# Patient Record
Sex: Male | Born: 1965 | Marital: Single | Smoking: Never smoker
Health system: Southern US, Community
[De-identification: ages and names within clinical notes are randomized; demographics above are authoritative.]

## PROBLEM LIST (undated history)

## (undated) DIAGNOSIS — E785 Hyperlipidemia, unspecified: Secondary | ICD-10-CM

## (undated) DIAGNOSIS — T7840XA Allergy, unspecified, initial encounter: Secondary | ICD-10-CM

## (undated) DIAGNOSIS — J45909 Unspecified asthma, uncomplicated: Secondary | ICD-10-CM

## (undated) DIAGNOSIS — I10 Essential (primary) hypertension: Secondary | ICD-10-CM

## (undated) HISTORY — DX: Essential (primary) hypertension: I10

## (undated) HISTORY — DX: Allergy, unspecified, initial encounter: T78.40XA

## (undated) HISTORY — PX: NASAL SEPTUM SURGERY: SHX37

## (undated) HISTORY — DX: Hyperlipidemia, unspecified: E78.5

## (undated) HISTORY — DX: Unspecified asthma, uncomplicated: J45.909

## (undated) HISTORY — PX: SHOULDER SURGERY: SHX246

---

## 2011-12-09 ENCOUNTER — Ambulatory Visit (INDEPENDENT_AMBULATORY_CARE_PROVIDER_SITE_OTHER): Payer: BC Managed Care – PPO | Admitting: Internal Medicine

## 2011-12-09 DIAGNOSIS — Z79899 Other long term (current) drug therapy: Secondary | ICD-10-CM

## 2011-12-09 DIAGNOSIS — I10 Essential (primary) hypertension: Secondary | ICD-10-CM

## 2011-12-09 DIAGNOSIS — J45909 Unspecified asthma, uncomplicated: Secondary | ICD-10-CM

## 2011-12-09 DIAGNOSIS — Z23 Encounter for immunization: Secondary | ICD-10-CM

## 2012-06-29 ENCOUNTER — Other Ambulatory Visit: Payer: Self-pay | Admitting: Physician Assistant

## 2012-06-30 ENCOUNTER — Other Ambulatory Visit: Payer: Self-pay | Admitting: Physician Assistant

## 2012-10-01 ENCOUNTER — Other Ambulatory Visit: Payer: Self-pay | Admitting: Physician Assistant

## 2012-10-01 NOTE — Telephone Encounter (Signed)
Chart pulled to PA pool at nurses station [phone]

## 2012-10-29 ENCOUNTER — Ambulatory Visit (INDEPENDENT_AMBULATORY_CARE_PROVIDER_SITE_OTHER): Payer: BC Managed Care – PPO | Admitting: Internal Medicine

## 2012-10-29 VITALS — BP 112/84 | HR 70 | Temp 97.9°F | Resp 16 | Ht 69.38 in | Wt 194.4 lb

## 2012-10-29 DIAGNOSIS — Z23 Encounter for immunization: Secondary | ICD-10-CM

## 2012-10-29 DIAGNOSIS — Z7189 Other specified counseling: Secondary | ICD-10-CM

## 2012-10-29 DIAGNOSIS — I10 Essential (primary) hypertension: Secondary | ICD-10-CM | POA: Insufficient documentation

## 2012-10-29 DIAGNOSIS — Z79899 Other long term (current) drug therapy: Secondary | ICD-10-CM

## 2012-10-29 DIAGNOSIS — E789 Disorder of lipoprotein metabolism, unspecified: Secondary | ICD-10-CM | POA: Insufficient documentation

## 2012-10-29 LAB — POCT URINALYSIS DIPSTICK
Blood, UA: NEGATIVE
Ketones, UA: NEGATIVE
Protein, UA: NEGATIVE
Spec Grav, UA: 1.015
Urobilinogen, UA: 0.2

## 2012-10-29 LAB — TSH: TSH: 1.422 u[IU]/mL (ref 0.350–4.500)

## 2012-10-29 LAB — COMPREHENSIVE METABOLIC PANEL
ALT: 25 U/L (ref 0–53)
CO2: 25 mEq/L (ref 19–32)
Creat: 0.97 mg/dL (ref 0.50–1.35)
Total Bilirubin: 0.7 mg/dL (ref 0.3–1.2)

## 2012-10-29 LAB — POCT CBC
Granulocyte percent: 57.9 %G (ref 37–80)
HCT, POC: 43.9 % (ref 43.5–53.7)
Lymph, poc: 1.4 (ref 0.6–3.4)
MCH, POC: 29.6 pg (ref 27–31.2)
MCHC: 33.3 g/dL (ref 31.8–35.4)
MCV: 88.9 fL (ref 80–97)
MID (cbc): 0.3 (ref 0–0.9)
POC LYMPH PERCENT: 34.8 %L (ref 10–50)
Platelet Count, POC: 234 10*3/uL (ref 142–424)
RDW, POC: 12.9 %
WBC: 4.1 10*3/uL — AB (ref 4.6–10.2)

## 2012-10-29 LAB — LIPID PANEL
Cholesterol: 187 mg/dL (ref 0–200)
HDL: 46 mg/dL (ref >39)
Total CHOL/HDL Ratio: 4.1 Ratio
Triglycerides: 160 mg/dL — ABNORMAL HIGH (ref <150)

## 2012-10-29 NOTE — Patient Instructions (Signed)

## 2012-10-29 NOTE — Progress Notes (Signed)
  Subjective:    Patient ID: ARJAN STANTON, male    DOB: 07/02/1966, 46 y.o.   MRN: [id number]  HPI Doing well, feels good. HTN and lipids controlled No problems with meds   Review of Systems stable    Objective:   Physical Exam  Constitutional: He is oriented to person, place, and time. He appears well-developed and well-nourished.  HENT:  Nose: Nose normal.  Eyes: EOM are normal.  Neck: Neck supple.  Cardiovascular: Normal rate, regular rhythm and normal heart sounds.   Pulmonary/Chest: Effort normal and breath sounds normal.  Neurological: He is alert and oriented to person, place, and time. Coordination normal.  Psychiatric: He has a normal mood and affect.    labs Results for orders placed in visit on 10/29/12  POCT CBC      Component Value Range   WBC 4.1 (*) 4.6 - 10.2 K/uL   Lymph, poc 1.4  0.6 - 3.4   POC LYMPH PERCENT 34.8  10 - 50 %L   MID (cbc) 0.3  0 - 0.9   POC MID % 7.3  0 - 12 %M   POC Granulocyte 2.4  2 - 6.9   Granulocyte percent 57.9  37 - 80 %G   RBC 4.94  4.69 - 6.13 M/uL   Hemoglobin 14.6  14.1 - 18.1 g/dL   HCT, POC 40.9  81.1 - 53.7 %   MCV 88.9  80 - 97 fL   MCH, POC 29.6  27 - 31.2 pg   MCHC 33.3  31.8 - 35.4 g/dL   RDW, POC 91.4     Platelet Count, POC 234  142 - 424 K/uL   MPV 7.6  0 - 99.8 fL  POCT URINALYSIS DIPSTICK      Component Value Range   Color, UA yellow     Clarity, UA clear     Glucose, UA neg     Bilirubin, UA neg     Ketones, UA neg     Spec Grav, UA 1.015     Blood, UA neg     pH, UA 5.0     Protein, UA neg     Urobilinogen, UA 0.2     Nitrite, UA neg     Leukocytes, UA Negative         Assessment & Plan:  RF meds 1 yr CPE January

## 2012-10-30 LAB — PSA: PSA: 1.15 ng/mL (ref <=4.00)

## 2012-11-02 ENCOUNTER — Encounter: Payer: Self-pay | Admitting: *Deleted

## 2012-11-03 ENCOUNTER — Telehealth: Payer: Self-pay

## 2012-11-03 MED ORDER — LISINOPRIL 10 MG PO TABS: 10.0000 mg | ORAL_TABLET | Freq: Every day | ORAL | Status: DC

## 2012-11-03 MED ORDER — ATORVASTATIN CALCIUM 10 MG PO TABS: 10.0000 mg | ORAL_TABLET | Freq: Every day | ORAL | Status: DC

## 2012-11-03 NOTE — Telephone Encounter (Signed)
Pt was seen in office last week, he was told by Dr Perrin Maltese that he would send in for 90 day rx for both lisinopril and liptor. He has called pharmacy and they state they have'nt received anything. Please contact pt to advise. [phone]

## 2012-11-03 NOTE — Telephone Encounter (Signed)
Sent in for him called him to advise.

## 2013-03-07 ENCOUNTER — Other Ambulatory Visit: Payer: Self-pay | Admitting: Internal Medicine

## 2013-03-08 ENCOUNTER — Telehealth: Payer: Self-pay

## 2013-03-08 NOTE — Telephone Encounter (Signed)
Patient scheduled an appt with Dr Perrin Maltese on 4/28 but is out of blood pressure meds. He would like a refill to last until his appt date.

## 2013-04-26 ENCOUNTER — Encounter: Payer: Self-pay | Admitting: Internal Medicine

## 2013-04-26 ENCOUNTER — Ambulatory Visit (INDEPENDENT_AMBULATORY_CARE_PROVIDER_SITE_OTHER): Payer: BC Managed Care – PPO | Admitting: Internal Medicine

## 2013-04-26 VITALS — BP 125/84 | HR 68 | Temp 97.8°F | Resp 16 | Ht 69.0 in | Wt 192.0 lb

## 2013-04-26 DIAGNOSIS — Z79899 Other long term (current) drug therapy: Secondary | ICD-10-CM

## 2013-04-26 DIAGNOSIS — E785 Hyperlipidemia, unspecified: Secondary | ICD-10-CM

## 2013-04-26 DIAGNOSIS — I10 Essential (primary) hypertension: Secondary | ICD-10-CM

## 2013-04-26 LAB — COMPREHENSIVE METABOLIC PANEL
Alkaline Phosphatase: 68 U/L (ref 39–117)
BUN: 11 mg/dL (ref 6–23)
Creat: 0.96 mg/dL (ref 0.50–1.35)
Glucose, Bld: 87 mg/dL (ref 70–99)
Sodium: 138 mEq/L (ref 135–145)
Total Bilirubin: 0.7 mg/dL (ref 0.3–1.2)

## 2013-04-26 LAB — LIPID PANEL
HDL: 48 mg/dL (ref >39)
LDL Cholesterol: 82 mg/dL (ref 0–99)
Total CHOL/HDL Ratio: 3.4 Ratio
Triglycerides: 161 mg/dL — ABNORMAL HIGH (ref <150)
VLDL: 32 mg/dL (ref 0–40)

## 2013-04-26 MED ORDER — ATORVASTATIN CALCIUM 10 MG PO TABS: 10.0000 mg | ORAL_TABLET | Freq: Every day | ORAL | Status: DC

## 2013-04-26 MED ORDER — ALBUTEROL SULFATE HFA 108 (90 BASE) MCG/ACT IN AERS: 2.0000 | INHALATION_SPRAY | Freq: Four times a day (QID) | RESPIRATORY_TRACT | Status: DC | PRN

## 2013-04-26 MED ORDER — LISINOPRIL 10 MG PO TABS: 10.0000 mg | ORAL_TABLET | Freq: Every day | ORAL | Status: DC

## 2013-04-26 NOTE — Patient Instructions (Signed)
Hay Fever  Hay fever is an allergic reaction to particles in the air. It cannot be passed from person to person. It cannot be cured, but it can be controlled.  CAUSES   Hay fever is caused by something that triggers an allergic reaction (allergens). The following are examples of allergens:   Ragweed.   Feathers.   Animal dander.   Grass and tree pollens.   Cigarette smoke.   House dust.   Pollution.  SYMPTOMS    Sneezing.   Runny or stuffy nose.   Tearing eyes.   Itchy eyes, nose, mouth, throat, skin, or other area.   Sore throat.   Headache.   Decreased sense of smell or taste.  DIAGNOSIS  Your caregiver will perform a physical exam and ask questions about the symptoms you are having.Allergy testing may be done to determine exactly what triggers your hay fever.   TREATMENT    Over-the-counter medicines may help symptoms. These include:   Antihistamines.   Decongestants. These may help with nasal congestion.   Your caregiver may prescribe medicines if over-the-counter medicines do not work.   Some people benefit from allergy shots when other medicines are not helpful.  HOME CARE INSTRUCTIONS    Avoid the allergen that is causing your symptoms, if possible.   Take all medicine as told by your caregiver.  SEEK MEDICAL CARE IF:    You have severe allergy symptoms and your current medicines are not helping.   Your treatment was working at one time, but you are now experiencing symptoms.   You have sinus congestion and pressure.   You develop a fever or headache.   You have thick nasal discharge.   You have asthma and have a worsening cough and wheezing.  SEEK IMMEDIATE MEDICAL CARE IF:    You have swelling of your tongue or lips.   You have trouble breathing.   You feel lightheaded or like you are going to faint.   You have cold sweats.   You have a fever.  Document Released: 12/16/2005 Document Revised: 03/09/2012 Document Reviewed: 03/13/2011  [employer] Patient Information 2013  [employer]

## 2013-04-26 NOTE — Progress Notes (Signed)
  Subjective:    Patient ID: Curtis Berger, male    DOB: 10-05-1966, 47 y.o.   MRN: [id number]  HPI HTN and high cholesterol controlled. Meds tolerated. Seasonal allergy controlled with otc AHs and prn albuterol.   Review of Systems None    Objective:   Physical Exam  Constitutional: He is oriented to person, place, and time. He appears well-developed and well-nourished.  HENT:  Nose: Nose normal.  Eyes: Conjunctivae and EOM are normal. Pupils are equal, round, and reactive to light.  Neck: Neck supple.  Cardiovascular: Normal rate, regular rhythm and normal heart sounds.   Pulmonary/Chest: Effort normal and breath sounds normal.  Lymphadenopathy:    He has no cervical adenopathy.  Neurological: He is alert and oriented to person, place, and time.  Psychiatric: He has a normal mood and affect.  PFR 600 L/min        Assessment & Plan:  Well controlled htn/lipids/allergy RF all meds 1 yr

## 2013-11-01 ENCOUNTER — Encounter: Payer: BC Managed Care – PPO | Admitting: Internal Medicine

## 2013-11-08 ENCOUNTER — Ambulatory Visit (INDEPENDENT_AMBULATORY_CARE_PROVIDER_SITE_OTHER): Payer: BC Managed Care – PPO | Admitting: Internal Medicine

## 2013-11-08 ENCOUNTER — Encounter: Payer: Self-pay | Admitting: Internal Medicine

## 2013-11-08 VITALS — BP 132/78 | HR 59 | Temp 98.1°F | Resp 16 | Ht 69.75 in | Wt 192.0 lb

## 2013-11-08 DIAGNOSIS — Z23 Encounter for immunization: Secondary | ICD-10-CM

## 2013-11-08 DIAGNOSIS — I10 Essential (primary) hypertension: Secondary | ICD-10-CM

## 2013-11-08 DIAGNOSIS — Z0000 Encounter for general adult medical examination without abnormal findings: Secondary | ICD-10-CM

## 2013-11-08 DIAGNOSIS — Z79899 Other long term (current) drug therapy: Secondary | ICD-10-CM

## 2013-11-08 DIAGNOSIS — E789 Disorder of lipoprotein metabolism, unspecified: Secondary | ICD-10-CM

## 2013-11-08 LAB — POCT UA - MICROSCOPIC ONLY
Bacteria, U Microscopic: NEGATIVE
Mucus, UA: NEGATIVE
WBC, Ur, HPF, POC: NEGATIVE
Yeast, UA: NEGATIVE

## 2013-11-08 LAB — COMPREHENSIVE METABOLIC PANEL
ALT: 32 U/L (ref 0–53)
AST: 32 U/L (ref 0–37)
Albumin: 4.5 g/dL (ref 3.5–5.2)
CO2: 27 mEq/L (ref 19–32)
Calcium: 9.5 mg/dL (ref 8.4–10.5)
Chloride: 103 mEq/L (ref 96–112)
Potassium: 4.5 mEq/L (ref 3.5–5.3)
Sodium: 139 mEq/L (ref 135–145)
Total Protein: 6.8 g/dL (ref 6.0–8.3)

## 2013-11-08 LAB — LIPID PANEL
Cholesterol: 162 mg/dL (ref 0–200)
HDL: 49 mg/dL (ref >39)
LDL Cholesterol: 90 mg/dL (ref 0–99)
Triglycerides: 113 mg/dL (ref <150)

## 2013-11-08 LAB — CBC WITH DIFFERENTIAL/PLATELET
Basophils Absolute: 0 10*3/uL (ref 0.0–0.1)
Eosinophils Relative: 1 % (ref 0–5)
Lymphocytes Relative: 21 % (ref 12–46)
Lymphs Abs: 0.8 10*3/uL (ref 0.7–4.0)
MCV: 83.5 fL (ref 78.0–100.0)
Neutro Abs: 2.7 10*3/uL (ref 1.7–7.7)
Neutrophils Relative %: 68 % (ref 43–77)
Platelets: 210 10*3/uL (ref 150–400)
RBC: 4.86 MIL/uL (ref 4.22–5.81)
WBC: 3.9 10*3/uL — ABNORMAL LOW (ref 4.0–10.5)

## 2013-11-08 LAB — IFOBT (OCCULT BLOOD): IFOBT: NEGATIVE

## 2013-11-08 LAB — POCT URINALYSIS DIPSTICK
Bilirubin, UA: NEGATIVE
Blood, UA: NEGATIVE
Glucose, UA: NEGATIVE
Nitrite, UA: NEGATIVE

## 2013-11-08 LAB — TSH: TSH: 1.472 u[IU]/mL (ref 0.350–4.500)

## 2013-11-08 NOTE — Progress Notes (Signed)
  Subjective:    Patient ID: Curtis Berger, male    DOB: [birth year], 47 y.o.   MRN: [id number]  HPI    Review of Systems  Constitutional: Negative.   HENT: Negative.   Eyes: Negative.   Respiratory: Negative.   Cardiovascular: Negative.   Gastrointestinal: Negative.   Endocrine: Negative.   Genitourinary: Negative.   Musculoskeletal: Negative.   Skin: Negative.   Allergic/Immunologic: Negative.   Neurological: Negative.   Hematological: Negative.   Psychiatric/Behavioral: Negative.        Objective:   Physical Exam        Assessment & Plan:

## 2013-11-08 NOTE — Patient Instructions (Signed)
DASH Diet  The DASH diet stands for "Dietary Approaches to Stop Hypertension." It is a healthy eating plan that has been shown to reduce high blood pressure (hypertension) in as little as 14 days, while also possibly providing other significant health benefits. These other health benefits include reducing the risk of breast cancer after menopause and reducing the risk of type 2 diabetes, heart disease, colon cancer, and stroke. Health benefits also include weight loss and slowing kidney failure in patients with chronic kidney disease.   DIET GUIDELINES   Limit salt (sodium). Your diet should contain less than 1500 mg of sodium daily.   Limit refined or processed carbohydrates. Your diet should include mostly whole grains. Desserts and added sugars should be used sparingly.   Include small amounts of heart-healthy fats. These types of fats include nuts, oils, and tub margarine. Limit saturated and trans fats. These fats have been shown to be harmful in the body.  CHOOSING FOODS   The following food groups are based on a 2000 calorie diet. See your Registered Dietitian for individual calorie needs.  Grains and Grain Products (6 to 8 servings daily)   Eat More Often: Whole-wheat bread, brown rice, whole-grain or wheat pasta, quinoa, popcorn without added fat or salt (air popped).   Eat Less Often: White bread, white pasta, white rice, cornbread.  Vegetables (4 to 5 servings daily)   Eat More Often: Fresh, frozen, and canned vegetables. Vegetables may be raw, steamed, roasted, or grilled with a minimal amount of fat.   Eat Less Often/Avoid: Creamed or fried vegetables. Vegetables in a cheese sauce.  Fruit (4 to 5 servings daily)   Eat More Often: All fresh, canned (in natural juice), or frozen fruits. Dried fruits without added sugar. One hundred percent fruit juice ( cup [237 mL] daily).   Eat Less Often: Dried fruits with added sugar. Canned fruit in light or heavy syrup.   Lean Meats, Fish, and Poultry (2 servings or less daily. One serving is 3 to 4 oz [85-114 g]).   Eat More Often: Ninety percent or leaner ground beef, tenderloin, sirloin. Round cuts of beef, chicken breast, turkey breast. All fish. Grill, bake, or broil your meat. Nothing should be fried.   Eat Less Often/Avoid: Fatty cuts of meat, turkey, or chicken leg, thigh, or wing. Fried cuts of meat or fish.  Dairy (2 to 3 servings)   Eat More Often: Low-fat or fat-free milk, low-fat plain or light yogurt, reduced-fat or part-skim cheese.   Eat Less Often/Avoid: Milk (whole, 2%).Whole milk yogurt. Full-fat cheeses.  Nuts, Seeds, and Legumes (4 to 5 servings per week)   Eat More Often: All without added salt.   Eat Less Often/Avoid: Salted nuts and seeds, canned beans with added salt.  Fats and Sweets (limited)   Eat More Often: Vegetable oils, tub margarines without trans fats, sugar-free gelatin. Mayonnaise and salad dressings.   Eat Less Often/Avoid: Coconut oils, palm oils, butter, stick margarine, cream, half and half, cookies, candy, pie.  FOR MORE INFORMATION  The Dash Diet Eating Plan: www.dashdiet.org  Document Released: 12/05/2011 Document Revised: 03/09/2012 Document Reviewed: 12/05/2011  [employer] Patient Information 2014 [employer]  Cholesterol  Cholesterol is a white, waxy, fat-like protein needed by your body in small amounts. The liver makes all the cholesterol you need. It is carried from the liver by the blood through the blood vessels. Deposits (plaque) may build up on blood vessel walls. This makes the arteries narrower and stiffer. Plaque increases   the risk for heart attack and stroke.  You cannot feel your cholesterol level even if it is very high. The only way to know is by a blood test to check your lipid (fats) levels. Once you know your cholesterol levels, you should keep a record of the test results. Work with your caregiver to to keep your levels in the desired range.   WHAT THE RESULTS MEAN:   Total cholesterol is a rough measure of all the cholesterol in your blood.   LDL is the so-called bad cholesterol. This is the type that deposits cholesterol in the walls of the arteries. You want this level to be low.   HDL is the good cholesterol because it cleans the arteries and carries the LDL away. You want this level to be high.   Triglycerides are fat that the body can either burn for energy or store. High levels are closely linked to heart disease.  DESIRED LEVELS:   Total cholesterol below 200.   LDL below 100 for people at risk, below 70 for very high risk.   HDL above 50 is good, above 60 is best.   Triglycerides below 150.  HOW TO LOWER YOUR CHOLESTEROL:   Diet.   Choose fish or white meat chicken and turkey, roasted or baked. Limit fatty cuts of red meat, fried foods, and processed meats, such as sausage and lunch meat.   Eat lots of fresh fruits and vegetables. Choose whole grains, beans, pasta, potatoes and cereals.   Use only small amounts of olive, corn or canola oils. Avoid butter, mayonnaise, shortening or palm kernel oils. Avoid foods with trans-fats.   Use skim/nonfat milk and low-fat/nonfat yogurt and cheeses. Avoid whole milk, cream, ice cream, egg yolks and cheeses. Healthy desserts include angel food cake, ginger snaps, animal crackers, hard candy, popsicles, and low-fat/nonfat frozen yogurt. Avoid pastries, cakes, pies and cookies.   Exercise.   A regular program helps decrease LDL and raises HDL.   Helps with weight control.   Do things that increase your activity level like gardening, walking, or taking the stairs.   Medication.   May be prescribed by your caregiver to help lowering cholesterol and the risk for heart disease.   You may need medicine even if your levels are normal if you have several risk factors.  HOME CARE INSTRUCTIONS    Follow your diet and exercise programs as suggested by your caregiver.   Take medications as directed.    Have blood work done when your caregiver feels it is necessary.  MAKE SURE YOU:    Understand these instructions.   Will watch your condition.   Will get help right away if you are not doing well or get worse.  Document Released: 09/10/2001 Document Revised: 03/09/2012 Document Reviewed: 03/02/2008  [employer] Patient Information 2014 [employer]

## 2013-11-08 NOTE — Progress Notes (Signed)
  Subjective:    Patient ID: Curtis Berger, male    DOB: Jan 05, 1966, 47 y.o.   MRN: [id number]  HPI Feels very good. Very fit, exercises, weight is perfect. HTN and cholesterol controlled with meds. Does not need ED meds any more.   Review of Systems  Constitutional: Negative.   Eyes: Negative.   Respiratory: Negative.   Cardiovascular: Negative.   Gastrointestinal: Negative.   Endocrine: Negative.   Genitourinary: Negative.   Musculoskeletal: Negative.   Skin: Negative.   Allergic/Immunologic: Negative.   Neurological: Negative.   Hematological: Negative.   Psychiatric/Behavioral: Negative.        Objective:   Physical Exam  Constitutional: He is oriented to person, place, and time. He appears well-developed and well-nourished.  HENT:  Head: Normocephalic.  Right Ear: External ear normal.  Left Ear: External ear normal.  Nose: Nose normal.  Mouth/Throat: Oropharynx is clear and moist.  Eyes: Pupils are equal, round, and reactive to light.  Neck: Normal range of motion. No tracheal deviation present. No thyromegaly present.  Cardiovascular: Normal rate, regular rhythm, normal heart sounds and intact distal pulses.   Pulmonary/Chest: Effort normal and breath sounds normal.  Abdominal: Soft. Bowel sounds are normal.  Genitourinary: Rectum normal, prostate normal and penis normal.  Musculoskeletal: Normal range of motion.  Lymphadenopathy:    He has no cervical adenopathy.  Neurological: He is alert and oriented to person, place, and time. He has normal reflexes. No cranial nerve deficit. He exhibits normal muscle tone. Coordination normal.  Skin: Skin is warm and dry.  Psychiatric: He has a normal mood and affect. His behavior is normal. Thought content normal.   EKG normal  Results for orders placed in visit on 11/08/13  POCT UA - MICROSCOPIC ONLY      Result Value Range   WBC, Ur, HPF, POC neg     RBC, urine, microscopic neg     Bacteria, U Microscopic neg      Mucus, UA neg     Epithelial cells, urine per micros 0-1     Crystals, Ur, HPF, POC neg     Casts, Ur, LPF, POC neg     Yeast, UA neg    POCT URINALYSIS DIPSTICK      Result Value Range   Color, UA yellow     Clarity, UA clear     Glucose, UA neg     Bilirubin, UA neg     Ketones, UA neg     Spec Grav, UA 1.010     Blood, UA neg     pH, UA 6.5     Protein, UA neg     Urobilinogen, UA 0.2     Nitrite, UA neg     Leukocytes, UA Negative    IFOBT (OCCULT BLOOD)      Result Value Range   IFOBT Negative          Assessment & Plan:  HTN/Lipids controlled RF meds 1 yr

## 2014-05-09 ENCOUNTER — Ambulatory Visit: Payer: BC Managed Care – PPO | Admitting: Family Medicine

## 2014-06-10 ENCOUNTER — Other Ambulatory Visit: Payer: Self-pay | Admitting: Internal Medicine

## 2014-09-16 ENCOUNTER — Other Ambulatory Visit: Payer: Self-pay | Admitting: Internal Medicine

## 2014-12-19 ENCOUNTER — Other Ambulatory Visit: Payer: Self-pay | Admitting: Physician Assistant

## 2015-01-26 ENCOUNTER — Ambulatory Visit (INDEPENDENT_AMBULATORY_CARE_PROVIDER_SITE_OTHER): Payer: BLUE CROSS/BLUE SHIELD | Admitting: Family Medicine

## 2015-01-26 VITALS — BP 140/90 | HR 68 | Temp 97.9°F | Resp 16 | Ht 68.75 in | Wt 205.0 lb

## 2015-01-26 DIAGNOSIS — I10 Essential (primary) hypertension: Secondary | ICD-10-CM

## 2015-01-26 DIAGNOSIS — J4520 Mild intermittent asthma, uncomplicated: Secondary | ICD-10-CM

## 2015-01-26 DIAGNOSIS — Z0000 Encounter for general adult medical examination without abnormal findings: Secondary | ICD-10-CM

## 2015-01-26 DIAGNOSIS — Z1211 Encounter for screening for malignant neoplasm of colon: Secondary | ICD-10-CM

## 2015-01-26 DIAGNOSIS — E785 Hyperlipidemia, unspecified: Secondary | ICD-10-CM

## 2015-01-26 LAB — COMPLETE METABOLIC PANEL WITH GFR
ALBUMIN: 4.6 g/dL (ref 3.5–5.2)
ALT: 33 U/L (ref 0–53)
AST: 29 U/L (ref 0–37)
Alkaline Phosphatase: 59 U/L (ref 39–117)
BILIRUBIN TOTAL: 0.9 mg/dL (ref 0.2–1.2)
BUN: 12 mg/dL (ref 6–23)
CHLORIDE: 102 meq/L (ref 96–112)
CO2: 28 mEq/L (ref 19–32)
CREATININE: 0.97 mg/dL (ref 0.50–1.35)
Calcium: 9.4 mg/dL (ref 8.4–10.5)
GFR, Est Non African American: >89 mL/min
Glucose, Bld: 96 mg/dL (ref 70–99)
POTASSIUM: 5 meq/L (ref 3.5–5.3)
Sodium: 139 mEq/L (ref 135–145)
TOTAL PROTEIN: 7 g/dL (ref 6.0–8.3)

## 2015-01-26 LAB — LIPID PANEL
Cholesterol: 194 mg/dL (ref 0–200)
HDL: 46 mg/dL (ref >39)
LDL CALC: 118 mg/dL — AB (ref 0–99)
Total CHOL/HDL Ratio: 4.2 Ratio
Triglycerides: 149 mg/dL (ref <150)
VLDL: 30 mg/dL (ref 0–40)

## 2015-01-26 MED ORDER — ALBUTEROL SULFATE HFA 108 (90 BASE) MCG/ACT IN AERS: 1.0000 | INHALATION_SPRAY | Freq: Four times a day (QID) | RESPIRATORY_TRACT | Status: DC | PRN

## 2015-01-26 MED ORDER — ATORVASTATIN CALCIUM 10 MG PO TABS: 10.0000 mg | ORAL_TABLET | Freq: Every day | ORAL | Status: DC

## 2015-01-26 MED ORDER — LISINOPRIL 10 MG PO TABS: 10.0000 mg | ORAL_TABLET | Freq: Every day | ORAL | Status: DC

## 2015-01-26 NOTE — Progress Notes (Signed)
Subjective:    Patient ID: Curtis Berger, male    DOB: 09/22/1966, 49 y.o.   MRN: [id number] This chart was scribed for [name], MD by Zola Button, Medical Scribe. This patient was seen in Room 12 and the patient's care was started at 8:54 AM.    HPI HPI Comments: Curtis Berger is a 49 y.o. male with a hx of HTN and HLD who presents to the [facility] for a complete physical exam.  He is also here for medication refill. He is a new patient to me, previously followed by Dr. Elder Cyphers. His last physical and visit was November 2014 with Dr. Elder Cyphers. Patient is fasting.  Cancer Screening: He has not had a colonoscopy yet, but agrees to have one set up.  Prostate Cancer Screening: He had a normal PSA November 2014. No FMHx of prostate cancer. He declines prostate screening at this time, but may have one done next time.  Immunizations: Patient did have the flu shot this year in November. His last TDAP was in January of 2009.  Depression Screening: Patient states he has been feeling happy and denies depression or lack of motivation. He does do things for enjoyment.  Exercise: Patient exercises 5 times a week at the [facility] doing cardio and interval classes. He denies CP and lightheadedness when exercising.  Dentist: He last saw his dentist about 6 months ago.  Eye Care Provider: He last had his eyes checked about a year and a half ago.   Hypertension: He takes Lisinopril 10 mg QD. Last kidney function was normal in 2014 with creatinine 1.01. Patient does have a BP cuff at home and has been getting usually below 140/90. He denies problems or side effects with his medication. Patient has been on Lisinopril for a long time.  Hyperlipidemia: Takes Lipitor 10 mg QD. LFTs normal and cholesterol controlled November 2014. He denies myalgias with the medications or other side effects. He has been on Lipitor for a long time.  Extrinsic Asthma: Patient is on an albuterol  inhaler because he has asthma during the allergy season in the spring. He only uses it for 2 months of the year, using it about twice a day.  Personal: Patient works in [employer]. He has lived in [place] for most of his life. He is currently single and denies any sexual partners.   Patient Active Problem List   Diagnosis Date Noted  . HTN (hypertension) 10/29/2012  . Lipid disorder 10/29/2012   Past Medical History  Diagnosis Date  . Allergy   . Asthma   . Hypertension   . Hyperlipidemia    History reviewed. No pertinent past surgical history. No Known Allergies Prior to Admission medications   Medication Sig Start Date End Date Taking? Authorizing Provider  albuterol (PROVENTIL HFA;VENTOLIN HFA) 108 (90 BASE) MCG/ACT inhaler Inhale 2 puffs into the lungs every 6 (six) hours as needed for wheezing. 04/26/13  Yes Orma Flaming, MD  atorvastatin (LIPITOR) 10 MG tablet Take 1 tablet (10 mg total) by mouth daily. PATIENT NEEDS AN OFFICE VISIT FOR ADDITIONAL REFILLS 12/19/14  Yes Orma Flaming, MD  lisinopril (PRINIVIL,ZESTRIL) 10 MG tablet Take 1 tablet (10 mg total) by mouth daily. PATIENT NEEDS AN OFFICE VISIT FOR ADDITIONAL REFILLS. 12/19/14  Yes Orma Flaming, MD   History   Social History  . Marital Status: Single    Spouse Name: N/A    Number of Children: N/A  .  Years of Education: N/A   Occupational History  . Not on file.   Social History Main Topics  . Smoking status: Never Smoker   . Smokeless tobacco: Not on file  . Alcohol Use: 0.6 oz/week    1 Glasses of wine per week  . Drug Use: No  . Sexual Activity: Yes   Other Topics Concern  . Not on file   Social History Narrative     Review of Systems 13 point ROS reviewed per patient survey.     Objective:   Physical Exam  Constitutional: He is oriented to person, place, and time. He appears well-developed and well-nourished.  HENT:  Head: Normocephalic and atraumatic.  Right Ear:  External ear normal.  Left Ear: External ear normal.  Mouth/Throat: Oropharynx is clear and moist.  Eyes: Conjunctivae and EOM are normal. Pupils are equal, round, and reactive to light.  Neck: Normal range of motion. Neck supple. No JVD present. Carotid bruit is not present. No thyromegaly present.  Cardiovascular: Normal rate, regular rhythm, normal heart sounds and intact distal pulses.   No murmur heard. Pulmonary/Chest: Effort normal and breath sounds normal. No respiratory distress. He has no wheezes. He has no rales.  Abdominal: Soft. He exhibits no distension. There is no tenderness. Hernia confirmed negative in the right inguinal area and confirmed negative in the left inguinal area.  Genitourinary:  Declined DRE.   Musculoskeletal: Normal range of motion. He exhibits no edema or tenderness.  Lymphadenopathy:    He has no cervical adenopathy.  Neurological: He is alert and oriented to person, place, and time. He has normal reflexes.  Skin: Skin is warm and dry.  Psychiatric: He has a normal mood and affect. His behavior is normal.  Vitals reviewed.   Filed Vitals:   01/26/15 0830  BP: 140/90  Pulse: 68  Temp: 97.9 F (36.6 C)  TempSrc: Oral  Resp: 16  Height: 5' 8.75" (1.746 m)  Weight: 205 lb (92.987 kg)  SpO2: 97%    Visual Acuity Screening   Right eye Left eye Both eyes  Without correction: 20/40 20/30 20/30   With correction:          Assessment & Plan:   Curtis Berger is a 49 y.o. male Annual physical exam - Plan: COMPLETE METABOLIC PANEL WITH GFR, CANCELED: Lipid panel  --anticipatory guidance as below in AVS, screening labs above. Health maintenance items as above in HPI discussed/recommended as applicable.   Screen for colon cancer - Plan: Ambulatory referral to Gastroenterology  We discussed pros and cons of prostate cancer screening, and after this discussion, he chose to defer screening at this time.   Essential hypertension - Plan: Lipid  panel, lisinopril (PRINIVIL,ZESTRIL) 10 MG tablet, COMPLETE METABOLIC PANEL WITH GFR, CANCELED: COMPLETE METABOLIC PANEL WITH GFR, CANCELED: Lipid panel  -stable and on same meds for some time - refilled for 1 year. Labs drawn.   Hyperlipidemia - Plan: Lipid panel, atorvastatin (LIPITOR) 10 MG tablet, COMPLETE METABOLIC PANEL WITH GFR, CANCELED: COMPLETE METABOLIC PANEL WITH GFR, CANCELED: Lipid panel  -stable prior and has been on lipitor for some time. Refilled. Lipids. cmp pending.    Extrinsic asthma, mild intermittent, uncomplicated - Plan: albuterol (PROVENTIL HFA;VENTOLIN HFA) 108 (90 BASE) MCG/ACT inhaler  - mild, intermittent during allergy season.  If frequent use - may need ICS - discussed call or OV if more frequent use. rtc precautions.   Meds ordered this encounter  Medications  . lisinopril (PRINIVIL,ZESTRIL) 10 MG tablet  Sig: Take 1 tablet (10 mg total) by mouth daily.    Dispense:  90 tablet    Refill:  3  . atorvastatin (LIPITOR) 10 MG tablet    Sig: Take 1 tablet (10 mg total) by mouth daily.    Dispense:  90 tablet    Refill:  3  . albuterol (PROVENTIL HFA;VENTOLIN HFA) 108 (90 BASE) MCG/ACT inhaler    Sig: Inhale 1-2 puffs into the lungs every 6 (six) hours as needed for wheezing.    Dispense:  1 Inhaler    Refill:  1   Patient Instructions  You should receive a call or letter about your lab results within the next week to 10 days.  We will refer you to gastroenterology for colon cancer screening.  Schedule eye provider appointment. Let me know if any questions.   Keeping you healthy  Get these tests  Blood pressure- Have your blood pressure checked once a year by your healthcare provider.  Normal blood pressure is 120/80  Weight- Have your body mass index (BMI) calculated to screen for obesity.  BMI is a measure of body fat based on height and weight. You can also calculate your own BMI at [email or web address].[email or web address].  Cholesterol- Have your cholesterol  checked every year.  Diabetes- Have your blood sugar checked regularly if you have high blood pressure, high cholesterol, have a family history of diabetes or if you are overweight.  Screening for Colon Cancer- Colonoscopy starting at age 20.  Screening may begin sooner depending on your family history and other health conditions. Follow up colonoscopy as directed by your Gastroenterologist.  Screening for Prostate Cancer- Both blood work (PSA) and a rectal exam help screen for Prostate Cancer.  Screening begins at age 65 with African-American men and at age 59 with Caucasian men.  Screening may begin sooner depending on your family history.  Take these medicines  Aspirin- One aspirin daily can help prevent Heart disease and Stroke.  Flu shot- Every fall.  Tetanus- Every 10 years.  Zostavax- Once after the age of 29 to prevent Shingles.  Pneumonia shot- Once after the age of 48; if you are younger than 49, ask your healthcare provider if you need a Pneumonia shot.  Take these steps  Don't smoke- If you do smoke, talk to your doctor about quitting.  For tips on how to quit, go to www.smokefree.gov or call 1-800-QUIT-NOW.  Be physically active- Exercise 5 days a week for at least 30 minutes.  If you are not already physically active start slow and gradually work up to 30 minutes of moderate physical activity.  Examples of moderate activity include walking briskly, mowing the yard, dancing, swimming, bicycling, etc.  Eat a healthy diet- Eat a variety of healthy food such as fruits, vegetables, low fat milk, low fat cheese, yogurt, lean meant, poultry, fish, beans, tofu, etc. For more information go to www.thenutritionsource.org  Drink alcohol in moderation- Limit alcohol intake to less than two drinks a day. Never drink and drive.  Dentist- Brush and floss twice daily; visit your dentist twice a year.  Depression- Your emotional health is as important as your physical health. If you're  feeling down, or losing interest in things you would normally enjoy please talk to your healthcare provider.  Eye exam- Visit your eye doctor every year.  Safe sex- If you may be exposed to a sexually transmitted infection, use a condom.  Seat belts- Seat belts can save your life; always wear one.  Smoke/Carbon  Monoxide detectors- These detectors need to be installed on the appropriate level of your home.  Replace batteries at least once a year.  Skin cancer- When out in the sun, cover up and use sunscreen 15 SPF or higher.  Violence- If anyone is threatening you, please tell your healthcare provider.  Living Will/ Health care power of attorney- Speak with your healthcare provider and family.

## 2015-01-26 NOTE — Patient Instructions (Signed)
You should receive a call or letter about your lab results within the next week to 10 days.  We will refer you to gastroenterology for colon cancer screening.  Schedule eye provider appointment. Let me know if any questions.   Keeping you healthy  Get these tests  Blood pressure- Have your blood pressure checked once a year by your healthcare provider.  Normal blood pressure is 120/80  Weight- Have your body mass index (BMI) calculated to screen for obesity.  BMI is a measure of body fat based on height and weight. You can also calculate your own BMI at ViewBanking[employer][email or web address].  Cholesterol- Have your cholesterol checked every year.  Diabetes- Have your blood sugar checked regularly if you have high blood pressure, high cholesterol, have a family history of diabetes or if you are overweight.  Screening for Colon Cancer- Colonoscopy starting at age 35.  Screening may begin sooner depending on your family history and other health conditions. Follow up colonoscopy as directed by your Gastroenterologist.  Screening for Prostate Cancer- Both blood work (PSA) and a rectal exam help screen for Prostate Cancer.  Screening begins at age 63 with African-American men and at age 53 with Caucasian men.  Screening may begin sooner depending on your family history.  Take these medicines  Aspirin- One aspirin daily can help prevent Heart disease and Stroke.  Flu shot- Every fall.  Tetanus- Every 10 years.  Zostavax- Once after the age of 69 to prevent Shingles.  Pneumonia shot- Once after the age of 70; if you are younger than 10, ask your healthcare provider if you need a Pneumonia shot.  Take these steps  Don't smoke- If you do smoke, talk to your doctor about quitting.  For tips on how to quit, go to www.smokefree.gov or call 1-800-QUIT-NOW.  Be physically active- Exercise 5 days a week for at least 30 minutes.  If you are not already physically active start slow and gradually work up to 30  minutes of moderate physical activity.  Examples of moderate activity include walking briskly, mowing the yard, dancing, swimming, bicycling, etc.  Eat a healthy diet- Eat a variety of healthy food such as fruits, vegetables, low fat milk, low fat cheese, yogurt, lean meant, poultry, fish, beans, tofu, etc. For more information go to www.thenutritionsource.org  Drink alcohol in moderation- Limit alcohol intake to less than two drinks a day. Never drink and drive.  Dentist- Brush and floss twice daily; visit your dentist twice a year.  Depression- Your emotional health is as important as your physical health. If you're feeling down, or losing interest in things you would normally enjoy please talk to your healthcare provider.  Eye exam- Visit your eye doctor every year.  Safe sex- If you may be exposed to a sexually transmitted infection, use a condom.  Seat belts- Seat belts can save your life; always wear one.  Smoke/Carbon Monoxide detectors- These detectors need to be installed on the appropriate level of your home.  Replace batteries at least once a year.  Skin cancer- When out in the sun, cover up and use sunscreen 15 SPF or higher.  Violence- If anyone is threatening you, please tell your healthcare provider.  Living Will/ Health care power of attorney- Speak with your healthcare provider and family.

## 2016-02-08 ENCOUNTER — Other Ambulatory Visit: Payer: Self-pay | Admitting: Family Medicine

## 2016-03-16 ENCOUNTER — Ambulatory Visit (INDEPENDENT_AMBULATORY_CARE_PROVIDER_SITE_OTHER): Payer: BLUE CROSS/BLUE SHIELD | Admitting: Family Medicine

## 2016-03-16 VITALS — BP 120/86 | HR 64 | Temp 97.9°F | Resp 20 | Ht 70.0 in | Wt 200.8 lb

## 2016-03-16 DIAGNOSIS — Z114 Encounter for screening for human immunodeficiency virus [HIV]: Secondary | ICD-10-CM

## 2016-03-16 DIAGNOSIS — I10 Essential (primary) hypertension: Secondary | ICD-10-CM | POA: Diagnosis not present

## 2016-03-16 DIAGNOSIS — Z125 Encounter for screening for malignant neoplasm of prostate: Secondary | ICD-10-CM

## 2016-03-16 DIAGNOSIS — E785 Hyperlipidemia, unspecified: Secondary | ICD-10-CM | POA: Diagnosis not present

## 2016-03-16 DIAGNOSIS — Z1159 Encounter for screening for other viral diseases: Secondary | ICD-10-CM | POA: Diagnosis not present

## 2016-03-16 DIAGNOSIS — J4520 Mild intermittent asthma, uncomplicated: Secondary | ICD-10-CM

## 2016-03-16 LAB — COMPLETE METABOLIC PANEL WITH GFR
ALBUMIN: 4.6 g/dL (ref 3.6–5.1)
ALK PHOS: 61 U/L (ref 40–115)
ALT: 32 U/L (ref 9–46)
AST: 26 U/L (ref 10–35)
BILIRUBIN TOTAL: 0.8 mg/dL (ref 0.2–1.2)
BUN: 14 mg/dL (ref 7–25)
CO2: 29 mmol/L (ref 20–31)
Calcium: 9.7 mg/dL (ref 8.6–10.3)
Chloride: 105 mmol/L (ref 98–110)
Creat: 1.07 mg/dL (ref 0.70–1.33)
GFR, EST NON AFRICAN AMERICAN: 80 mL/min (ref >=60)
GLUCOSE: 106 mg/dL — AB (ref 65–99)
Potassium: 5.3 mmol/L (ref 3.5–5.3)
SODIUM: 140 mmol/L (ref 135–146)
TOTAL PROTEIN: 6.9 g/dL (ref 6.1–8.1)

## 2016-03-16 LAB — LIPID PANEL
CHOL/HDL RATIO: 4.3 ratio (ref <=5.0)
Cholesterol: 162 mg/dL (ref 125–200)
HDL: 38 mg/dL — AB (ref >=40)
LDL Cholesterol: 98 mg/dL (ref <130)
TRIGLYCERIDES: 129 mg/dL (ref <150)
VLDL: 26 mg/dL (ref <30)

## 2016-03-16 LAB — HEPATITIS C ANTIBODY: HCV Ab: NEGATIVE

## 2016-03-16 LAB — HIV ANTIBODY (ROUTINE TESTING W REFLEX): HIV 1&2 Ab, 4th Generation: NONREACTIVE

## 2016-03-16 MED ORDER — LISINOPRIL 10 MG PO TABS: 10.0000 mg | ORAL_TABLET | Freq: Every day | ORAL | Status: DC

## 2016-03-16 MED ORDER — ATORVASTATIN CALCIUM 10 MG PO TABS: 10.0000 mg | ORAL_TABLET | Freq: Every day | ORAL | Status: DC

## 2016-03-16 MED ORDER — ALBUTEROL SULFATE HFA 108 (90 BASE) MCG/ACT IN AERS: 1.0000 | INHALATION_SPRAY | Freq: Four times a day (QID) | RESPIRATORY_TRACT | Status: DC | PRN

## 2016-03-16 NOTE — Patient Instructions (Addendum)
     IF you received an x-ray today, you will receive an invoice from [facility]. Please contact [facility] at [phone] with questions or concerns regarding your invoice.   IF you received labwork today, you will receive an invoice from [employer]. Please contact [facility] at [phone] with questions or concerns regarding your invoice.   Our billing staff will not be able to assist you with questions regarding bills from these companies.  You will be contacted with the lab results as soon as they are available. The fastest way to get your results is to activate your My Chart account. Instructions are located on the last page of this paperwork. If you have not heard from [place] regarding the results in 2 weeks, please contact this office.     No change in medications for now. I sent in one year of refills. Call [facility] to schedule colonoscopy. For asthma, if you do have to use the albuterol frequently, or more than once or twice per day, would recommend an inhaled corticosteroid such as Qvar. Let me know if you have these symptoms and can send that in. Plan on physical in one year. Return to the clinic or go to the nearest emergency room if any of your symptoms worsen or new symptoms occur.   [facility]: [phone]

## 2016-03-16 NOTE — Progress Notes (Signed)
Subjective:    Patient ID: Curtis Berger, male    DOB: 1966/08/15, 50 y.o.   MRN: [id number] By signing my name below, I, Judithe Modest, attest that this documentation has been prepared under the direction and in the presence of [name], MD. Electronically Signed: Judithe Modest, ER Scribe. 03/16/2016. 10:23 AM.  Chief Complaint  Patient presents with  . Medication Refill    all meds    HPI HPI Comments: Curtis Berger is a 50 y.o. male who presents to [facility] reporting for refill of medications. Last visit in January 2016 for a full physical. He had been stable for some time at that point, so his medications were refilled for one year at that time. He denies HA, abdominal pain, chest pain, nausea vomiting or any other side effects associated with his medications.   HTN: Lab Results  Component Value Date   CREATININE 0.97 01/26/2015   HLD: Lab Results  Component Value Date   CHOL 194 01/26/2015   HDL 46 01/26/2015   LDLCALC 118* 01/26/2015   TRIG 149 01/26/2015   CHOLHDL 4.2 01/26/2015   Lab Results  Component Value Date   ALT 33 01/26/2015   AST 29 01/26/2015   ALKPHOS 59 01/26/2015   BILITOT 0.9 01/26/2015   LDL slightly elevated on reading in last January, but overall remainder of cholesterol looked good.   Asthma: Pt has a hx of mild asthma. Uses albuterol in spring only, up to twice pre day. Was continued on albuterol PRN at last visit in January 2016, one inhaler with one refill given last year. He has used his inhaler once so far this spring. The time of year that he usually has to use it is coming up over the next couple of months.  Health maintenance: He has not had a colonoscopy. He has never had an HIV or hepatitis C test.    Patient Active Problem List   Diagnosis Date Noted  . HTN (hypertension) 10/29/2012  . Lipid disorder 10/29/2012   Past Medical History  Diagnosis Date  . Allergy   . Asthma   . Hypertension   .  Hyperlipidemia    No past surgical history on file. No Known Allergies Prior to Admission medications   Medication Sig Start Date End Date Taking? Authorizing Provider  albuterol (PROVENTIL HFA;VENTOLIN HFA) 108 (90 BASE) MCG/ACT inhaler Inhale 1-2 puffs into the lungs every 6 (six) hours as needed for wheezing. 01/26/15  Yes Wendie Agreste, MD  atorvastatin (LIPITOR) 10 MG tablet TAKE ONE TABLET BY MOUTH ONCE DAILY 02/10/16  Yes Mancel Bale, PA-C  lisinopril (PRINIVIL,ZESTRIL) 10 MG tablet TAKE ONE TABLET BY MOUTH ONCE DAILY 02/10/16  Yes Mancel Bale, PA-C   Social History   Social History  . Marital Status: Single    Spouse Name: N/A  . Number of Children: N/A  . Years of Education: N/A   Occupational History  . Not on file.   Social History Main Topics  . Smoking status: Never Smoker   . Smokeless tobacco: Not on file  . Alcohol Use: 0.6 oz/week    1 Glasses of wine per week  . Drug Use: No  . Sexual Activity: Yes   Other Topics Concern  . Not on file   Social History Narrative    Review of Systems  Constitutional: Negative for fever, chills, fatigue and unexpected weight change.  Eyes: Negative for visual disturbance.  Respiratory: Negative for cough, chest  tightness and shortness of breath.   Cardiovascular: Negative for chest pain, palpitations and leg swelling.  Gastrointestinal: Negative for abdominal pain and blood in stool.  Neurological: Negative for dizziness, light-headedness and headaches.      Objective:  BP 120/86 mmHg  Pulse 64  Temp(Src) 97.9 F (36.6 C) (Oral)  Resp 20  Ht 5\' 10"  (1.778 m)  Wt 200 lb 12.8 oz (91.082 kg)  BMI 28.81 kg/m2  SpO2 98%  Physical Exam  Constitutional: He is oriented to person, place, and time. He appears well-developed and well-nourished. No distress.  HENT:  Head: Normocephalic and atraumatic.  Eyes: EOM are normal. Pupils are equal, round, and reactive to light.  Neck: Neck supple. No JVD present. Carotid  bruit is not present.  Cardiovascular: Normal rate, regular rhythm and normal heart sounds.   No murmur heard. Pulmonary/Chest: Effort normal and breath sounds normal. No respiratory distress. He has no rales.  Genitourinary: Prostate normal.  Musculoskeletal: Normal range of motion. He exhibits no edema.  Neurological: He is alert and oriented to person, place, and time. Coordination normal.  Skin: Skin is warm and dry. He is not diaphoretic.  Psychiatric: He has a normal mood and affect. His behavior is normal.  Nursing note and vitals reviewed.     Assessment & Plan:   Curtis Berger is a 50 y.o. male Essential hypertension - Plan: COMPLETE METABOLIC PANEL WITH GFR, lisinopril (PRINIVIL,ZESTRIL) 10 MG tablet  - Stable, refill lisinopril same dose. Labs pending.  Hyperlipidemia - Plan: COMPLETE METABOLIC PANEL WITH GFR, Lipid panel, atorvastatin (LIPITOR) 10 MG tablet  -Borderline elevated LDL last year, repeat lipids. Continue Lipitor 10 mg daily for now.  Asthma, mild intermittent, uncomplicated - Plan: albuterol (PROVENTIL HFA;VENTOLIN HFA) 108 (90 Base) MCG/ACT inhaler  - Seasonal, discussed adding Qvar if persistent need for albuterol. Continue albuterol by itself for now. RTC precautions.  Screening for HIV (human immunodeficiency virus) - Plan: HIV antibody  Need for hepatitis C screening test - Plan: Hepatitis C antibody  Screening for prostate cancer - Plan: PSA  -We discussed pros and cons of prostate cancer screening, and after this discussion, he chose to have screening done. PSA obtained, and no concerning findings on DRE.     Meds ordered this encounter  Medications  . albuterol (PROVENTIL HFA;VENTOLIN HFA) 108 (90 Base) MCG/ACT inhaler    Sig: Inhale 1-2 puffs into the lungs every 6 (six) hours as needed for wheezing.    Dispense:  1 Inhaler    Refill:  1  . atorvastatin (LIPITOR) 10 MG tablet    Sig: Take 1 tablet (10 mg total) by mouth daily.     Dispense:  90 tablet    Refill:  3  . lisinopril (PRINIVIL,ZESTRIL) 10 MG tablet    Sig: Take 1 tablet (10 mg total) by mouth daily.    Dispense:  90 tablet    Refill:  3   Patient Instructions       IF you received an x-ray today, you will receive an invoice from [facility]. Please contact [facility] at [phone] with questions or concerns regarding your invoice.   IF you received labwork today, you will receive an invoice from [employer]. Please contact [facility] at [phone] with questions or concerns regarding your invoice.   Our billing staff will not be able to assist you with questions regarding bills from these companies.  You will be contacted with the lab results as soon as they  are available. The fastest way to get your results is to activate your My Chart account. Instructions are located on the last page of this paperwork. If you have not heard from [place] regarding the results in 2 weeks, please contact this office.     No change in medications for now. I sent in one year of refills. Call [facility] to schedule colonoscopy. For asthma, if you do have to use the albuterol frequently, or more than once or twice per day, would recommend an inhaled corticosteroid such as Qvar. Let me know if you have these symptoms and can send that in. Plan on physical in one year. Return to the clinic or go to the nearest emergency room if any of your symptoms worsen or new symptoms occur.   [facility]: [phone]     I personally performed the services described in this documentation, which was scribed in my presence. The recorded information has been reviewed and considered, and addended by me as needed.

## 2016-03-18 LAB — PSA: PSA: 1 ng/mL (ref <=4.00)

## 2016-09-17 ENCOUNTER — Ambulatory Visit (INDEPENDENT_AMBULATORY_CARE_PROVIDER_SITE_OTHER): Payer: BLUE CROSS/BLUE SHIELD | Admitting: Sports Medicine

## 2016-09-17 ENCOUNTER — Encounter: Payer: Self-pay | Admitting: Sports Medicine

## 2016-09-17 VITALS — BP 128/80 | Ht 70.0 in | Wt 190.0 lb

## 2016-09-17 DIAGNOSIS — M25511 Pain in right shoulder: Secondary | ICD-10-CM | POA: Insufficient documentation

## 2016-09-17 DIAGNOSIS — M66821 Spontaneous rupture of other tendons, right upper arm: Secondary | ICD-10-CM | POA: Diagnosis not present

## 2016-09-17 DIAGNOSIS — S46111A Strain of muscle, fascia and tendon of long head of biceps, right arm, initial encounter: Secondary | ICD-10-CM

## 2016-09-17 NOTE — Progress Notes (Signed)
Subjective:    Patient ID: Curtis Berger, male    DOB: 10/29/1966, 50 y.o.   MRN: [id number]  HPI Curtis Berger is a 50 y.o. male that presents for chronic right shoulder pain.   He reports that he has had intermittent pain for several years but that over the last year pain has become more constant.  He reports a constant ache in the anterior right shoulder.  He reports increased pain after throwing activities like football/ Frisbee.  Reports that pain is minimally relieved by NSAIDs.  He reports loss of ER over head and IR in general.  He reports that pain will wake him from sleep now if he rolls onto the right side.  Endorses sensation of shoulder wanting to give out when he lifts a pitcher of water above shoulder level.  Denies numbness, tingling.  Denies h/o injury to the right shoulder.  Patient is right hand dominant.  He is wondering if he might have an injection into the shoulder today.    Past Hx of heavy lifting in HS/ college and some shoulder pain on bench  No Known Allergies  Past Medical History:  Diagnosis Date  . Allergy   . Asthma   . Hyperlipidemia   . Hypertension    No past surgical history on file.  Current Outpatient Prescriptions:  .  albuterol (PROVENTIL HFA;VENTOLIN HFA) 108 (90 Base) MCG/ACT inhaler, Inhale 1-2 puffs into the lungs every 6 (six) hours as needed for wheezing., Disp: 1 Inhaler, Rfl: 1 .  atorvastatin (LIPITOR) 10 MG tablet, Take 1 tablet (10 mg total) by mouth daily., Disp: 90 tablet, Rfl: 3 .  lisinopril (PRINIVIL,ZESTRIL) 10 MG tablet, Take 1 tablet (10 mg total) by mouth daily., Disp: 90 tablet, Rfl: 3 Review of Systems No neck pain No radicular sxs into arms No numbness in hands    Objective:   Physical Exam  Gen: awake, alert, well appearing male, NAD MSK: Full painless AROM in flexion and aBduction.  Full AROM in ER of shoulder but patient notes mild discomfort.  Marked loss of IR of right shoulder compared to left.  Milt  TTP to anterior and posterior shoulder.  No bony deformities appreciated.  5/5 SITS testing.  +empty can right,  tenderness with crossover test.   +O'brien's, +clunk on right shoulder, +Jobe's relocation test. Neuro: light touch sensation in tact Skin: no ecchymosis or erythema appreciated.  Ultrasound RT shoulder  Right Biceps tendon w/ a hypoechoic area suggestive of a partial tear.  Right Subscapularis in tact.  Right- small bone spur appreciated on supraspinatus view.  Right supraspinatus in tact but spur impinges on tendon   Hypoechoic bulging capsule w calcification appreciated at the right [facility] joint.  Left AC joint also with hypoechoic area suggestive of inflammation as well.   Right teres minor in tact.  Posterior labrum also appears to be in tact.    Assessment & Plan:   Right shoulder pain Suspect that patient has a labral tear.  Ultrasound reveals an intact posterior labrum.  Anterior labrum not visualized.  SITS muscles/tendons grossly normal.  This may be a result of an old injury from weight lifting.  Bilateral AC joints certainly revealed changes associated with weight lifting.  HEP for right shoulder reviewed and handout provided.  See AVS.    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  Recommended to not curl anything greater than 3-5 lbs on right side.  HEP for  shoulder provided.  Will plan to repeat ultrasound in 3 months.  Curtis M. Lajuana Ripple, DO PGY-3, [facility]

## 2016-09-17 NOTE — Assessment & Plan Note (Signed)
Suspect that patient has a labral tear.  Ultrasound reveals an intact posterior labrum.  Anterior labrum not visualized.  SITS muscles/tendons grossly normal.  This may be a result of an old injury from weight lifting.  Bilateral AC joints certainly revealed changes associated with weight lifting.  HEP for right shoulder reviewed and handout provided.  See AVS.

## 2016-09-17 NOTE — Assessment & Plan Note (Signed)
Partial tear appreciated on ultrasound.  Recommended to not curl anything greater than 3-5 lbs on right side.  HEP for shoulder provided.  Will plan to repeat ultrasound in 3 months.

## 2016-09-17 NOTE — Patient Instructions (Signed)
I suspect you have a labral tear.  You definitely have a partial biceps tendon tear.  Perform the following until your follow up appointment:  -3 sets of 30 of light biceps curls (3-5 lb wt). -3 sets of 15 rotating in and 3 sets of 15 rotating out w. 5 lb wt -clock exercise 3 sets of 15 w/ 3-5 lb weight.  Perform up to 90 degrees.  DO NOT go past 90 degrees. -using a dumbbell rowing motion up high and rowing motion down low  Follow up in 12 weeks

## 2016-12-03 ENCOUNTER — Other Ambulatory Visit: Payer: Self-pay | Admitting: *Deleted

## 2016-12-03 ENCOUNTER — Encounter: Payer: Self-pay | Admitting: Sports Medicine

## 2016-12-03 ENCOUNTER — Ambulatory Visit (INDEPENDENT_AMBULATORY_CARE_PROVIDER_SITE_OTHER): Payer: BLUE CROSS/BLUE SHIELD | Admitting: Sports Medicine

## 2016-12-03 VITALS — BP 136/89 | HR 75 | Ht 70.0 in | Wt 190.0 lb

## 2016-12-03 DIAGNOSIS — M66821 Spontaneous rupture of other tendons, right upper arm: Secondary | ICD-10-CM | POA: Diagnosis not present

## 2016-12-03 DIAGNOSIS — M25511 Pain in right shoulder: Secondary | ICD-10-CM

## 2016-12-03 DIAGNOSIS — S46111A Strain of muscle, fascia and tendon of long head of biceps, right arm, initial encounter: Secondary | ICD-10-CM

## 2016-12-03 NOTE — Assessment & Plan Note (Signed)
Continue to rehab this  May require Tenodesis if persists

## 2016-12-03 NOTE — Progress Notes (Signed)
  Curtis Berger - 50 y.o. male MRN [id number]  Date of birth: [birth year]  SUBJECTIVE:  Including CC & ROS.  CC: Right shoulder pain follow-up   Curtis Berger is a 50yo right hand dominant, male presenting for chronic right shoulder pain follow up. Pt reports that since last being seen 3 months ago he has done the recommended at home exercises and modified his lifting. He mentions that the symptoms are still the same. He mentions having a constant soreness in his right shoulder that is aggravated by the putting on shirt or jacket.  He has not taken any medications consistently for the pain but mentions taking advil intermittently with some relief.   ROS: No swelling  No fever No numbness No injury   HISTORY: Past Medical, Surgical, Social, and Family History Reviewed & Updated per EMR.   Pertinent Historical Findings include: PMSHx - asthma, HLD, HTN   PSHx - Denies using tobacco  Medications - lisinopril, lipitor, albuterol   DATA REVIEWED: U/S right shoulder 09/17/16 reviewed   PHYSICAL EXAM:  VS: BP:136/89  HR:75bpm  TEMP: ( )  RESP:   HT:5\' 10"  (177.8 cm)   WT:190 lb (86.2 kg)  BMI:27.3 PHYSICAL EXAM: Gen: NAD, alert, cooperative with exam, well-appearing  Right Shoulder: Mild ttp over anterior and posterior shoulder. No Deformities noted. ROM is full with extension and abduction with discomfort. Limited ROM with internal rotation on back scratch or cross over Rotator cuff strength normal throughout. Pain with cross over test.  O'Brien's test positive.   No signs of impingement with negative Neer and Hawkin's tests, empty can sign. Speeds and Yergason's tests positive.   Left Shoulder:  No abnormality on inspection noted No TTP ROM is full Strength in SITS muscles wnl No signs of impingement with negative Neer and Hawkin's tests, empty can sign. Speeds and Yergason's tests negative.    ASSESSMENT & PLAN:  Right shoulder pain: Chronic. Consistent with  right labral tear from U/S on 09/17/16. Has remained the same with conservative treatment of at home PT/exercise.   -MRI of right shoulder  -Discussed next step in treatment including surgery and pt understands risks/benefits. Pending MRI, orthopedic referral.   Partial right biceps tendon tear: Chronic.  -Continue to limit curl of heavy objects -Discussed treatment course if conservative treatment did not improve/manage symptoms.   I observed and examined the patient with the resident and agree with assessment and plan.  Note reviewed and modified by me. Stefanie Libel, MD

## 2016-12-03 NOTE — Assessment & Plan Note (Signed)
Strongly suspicious for labral injury  Await MRI results but consider referral

## 2016-12-09 ENCOUNTER — Ambulatory Visit
Admission: RE | Admit: 2016-12-09 | Discharge: 2016-12-09 | Disposition: A | Discharge status: Home / Self Care | Payer: BLUE CROSS/BLUE SHIELD | Source: Ambulatory Visit | Attending: Sports Medicine | Admitting: Sports Medicine

## 2016-12-09 DIAGNOSIS — M25511 Pain in right shoulder: Secondary | ICD-10-CM

## 2016-12-10 ENCOUNTER — Encounter: Payer: Self-pay | Admitting: *Deleted

## 2016-12-10 NOTE — Patient Instructions (Signed)
[facility] [facility] Friday 12-13-16 at 12:30pm Arrival time is noon for new patient registration [address], [place], [place] [address] Phone: [phone]

## 2017-03-19 ENCOUNTER — Other Ambulatory Visit: Payer: Self-pay | Admitting: Family Medicine

## 2017-03-19 DIAGNOSIS — I10 Essential (primary) hypertension: Secondary | ICD-10-CM

## 2017-03-19 DIAGNOSIS — E785 Hyperlipidemia, unspecified: Secondary | ICD-10-CM

## 2017-03-20 NOTE — Telephone Encounter (Signed)
Last OV was 02/2016. Needs an office visit for additional refills, courtesy refill provided.

## 2017-04-23 ENCOUNTER — Telehealth: Payer: Self-pay | Admitting: Family Medicine

## 2017-04-23 DIAGNOSIS — I10 Essential (primary) hypertension: Secondary | ICD-10-CM

## 2017-04-23 MED ORDER — LISINOPRIL 10 MG PO TABS: 10.0000 mg | ORAL_TABLET | Freq: Every day | ORAL | 0 refills | Status: DC

## 2017-04-23 MED ORDER — ATORVASTATIN CALCIUM 10 MG PO TABS: 10.0000 mg | ORAL_TABLET | Freq: Every day | ORAL | 0 refills | Status: DC

## 2017-04-23 NOTE — Telephone Encounter (Signed)
Pt is needing a refill on his cholesteral meds and blood pressure meds   Best number [phone]

## 2017-04-23 NOTE — Telephone Encounter (Signed)
Gave 2 weeks needs ov , tx up front for appt

## 2017-05-02 ENCOUNTER — Ambulatory Visit (INDEPENDENT_AMBULATORY_CARE_PROVIDER_SITE_OTHER): Payer: BLUE CROSS/BLUE SHIELD | Admitting: Family Medicine

## 2017-05-02 ENCOUNTER — Encounter: Payer: Self-pay | Admitting: Family Medicine

## 2017-05-02 VITALS — BP 110/74 | HR 61 | Temp 98.4°F | Resp 16 | Ht 69.25 in | Wt 199.8 lb

## 2017-05-02 DIAGNOSIS — I10 Essential (primary) hypertension: Secondary | ICD-10-CM

## 2017-05-02 DIAGNOSIS — R739 Hyperglycemia, unspecified: Secondary | ICD-10-CM

## 2017-05-02 DIAGNOSIS — E785 Hyperlipidemia, unspecified: Secondary | ICD-10-CM

## 2017-05-02 DIAGNOSIS — J4520 Mild intermittent asthma, uncomplicated: Secondary | ICD-10-CM | POA: Diagnosis not present

## 2017-05-02 LAB — LIPID PANEL
Chol/HDL Ratio: 3.7 ratio (ref 0.0–5.0)
Cholesterol, Total: 170 mg/dL (ref 100–199)
HDL: 46 mg/dL (ref >39)
LDL CALC: 104 mg/dL — AB (ref 0–99)
Triglycerides: 98 mg/dL (ref 0–149)
VLDL CHOLESTEROL CAL: 20 mg/dL (ref 5–40)

## 2017-05-02 LAB — COMPREHENSIVE METABOLIC PANEL
ALBUMIN: 4.7 g/dL (ref 3.5–5.5)
ALT: 29 IU/L (ref 0–44)
AST: 31 IU/L (ref 0–40)
Albumin/Globulin Ratio: 2.6 — ABNORMAL HIGH (ref 1.2–2.2)
Alkaline Phosphatase: 67 IU/L (ref 39–117)
BUN/Creatinine Ratio: 15 (ref 9–20)
BUN: 16 mg/dL (ref 6–24)
Bilirubin Total: 0.9 mg/dL (ref 0.0–1.2)
CO2: 24 mmol/L (ref 18–29)
CREATININE: 1.05 mg/dL (ref 0.76–1.27)
Calcium: 9.4 mg/dL (ref 8.7–10.2)
Chloride: 98 mmol/L (ref 96–106)
GFR, EST AFRICAN AMERICAN: 94 mL/min/{1.73_m2} (ref >59)
GFR, EST NON AFRICAN AMERICAN: 81 mL/min/{1.73_m2} (ref >59)
GLUCOSE: 95 mg/dL (ref 65–99)
Globulin, Total: 1.8 g/dL (ref 1.5–4.5)
Potassium: 5.2 mmol/L (ref 3.5–5.2)
Sodium: 137 mmol/L (ref 134–144)
TOTAL PROTEIN: 6.5 g/dL (ref 6.0–8.5)

## 2017-05-02 LAB — HEMOGLOBIN A1C
ESTIMATED AVERAGE GLUCOSE: 105 mg/dL
Hgb A1c MFr Bld: 5.3 % (ref 4.8–5.6)

## 2017-05-02 MED ORDER — LISINOPRIL 10 MG PO TABS: 10.0000 mg | ORAL_TABLET | Freq: Every day | ORAL | 2 refills | Status: DC

## 2017-05-02 MED ORDER — ALBUTEROL SULFATE HFA 108 (90 BASE) MCG/ACT IN AERS: 1.0000 | INHALATION_SPRAY | Freq: Four times a day (QID) | RESPIRATORY_TRACT | 1 refills | Status: DC | PRN

## 2017-05-02 MED ORDER — ATORVASTATIN CALCIUM 10 MG PO TABS: 10.0000 mg | ORAL_TABLET | Freq: Every day | ORAL | 2 refills | Status: DC

## 2017-05-02 NOTE — Patient Instructions (Addendum)
  No change in medication doses for now. In addition to cholesterol and other electrolyte tests, I will check a 3 month blood sugar test as your blood sugar was borderline elevated previously. Follow-up within the next 6-9 months for a physical. Let me know if you have questions in the meantime.   IF you received an x-ray today, you will receive an invoice from [facility]. Please contact [facility] at [phone] with questions or concerns regarding your invoice.   IF you received labwork today, you will receive an invoice from [place]. Please contact [facility] at [phone] with questions or concerns regarding your invoice.   Our billing staff will not be able to assist you with questions regarding bills from these companies.  You will be contacted with the lab results as soon as they are available. The fastest way to get your results is to activate your My Chart account. Instructions are located on the last page of this paperwork. If you have not heard from [place] regarding the results in 2 weeks, please contact this office.

## 2017-05-02 NOTE — Progress Notes (Addendum)
Subjective:  By signing my name below, I, Curtis Berger, attest that this documentation has been prepared under the direction and in the presence of Wendie Agreste, MD Electronically Signed: Ladene Artist, ED Scribe 05/02/2017 at 8:19 AM.   Patient ID: Curtis Berger, male    DOB: [birth year], 51 y.o.   MRN: [id number]  Chief Complaint  Patient presents with  . Medication Refill    Albuterol, Lipitor, Lisinopril   HPI Curtis Berger is a 51 y.o. male who presents to Primary Care at [facility] for medication refill.   HTN Lab Results  Component Value Date   CREATININE 1.07 03/16/2016  Takes Lisinopril 10 mg qd. Pt does not check his BP at home. He denies chest pain, sob, light-headedness, dizziness, HA, abdominal pain, melena, blood in stools.   Hyperlipidemia  Lab Results  Component Value Date   CHOL 162 03/16/2016   HDL 38 (L) 03/16/2016   LDLCALC 98 03/16/2016   TRIG 129 03/16/2016   CHOLHDL 4.3 03/16/2016   Lab Results  Component Value Date   ALT 32 03/16/2016   AST 26 03/16/2016   ALKPHOS 61 03/16/2016   BILITOT 0.8 03/16/2016  Takes Lipitor 10 mg qd. Pt is fasting at this visit. Glucose slightly elevated on glucose last year. Pt exercises 5-7 days/week at minimum.  Body mass index is 29.29 kg/m.  Asthma Mild, intermittent. Albuterol used during Spring time typically. Pt has been using his inhaler once every 2-3 days. He has been taking Nasocort to treat environmental allergies.   Pt reports right rotator cuff surgery at the beginning of this year repaired by Dr. Oneida Alar.   Patient Active Problem List   Diagnosis Date Noted  . Right shoulder pain 09/17/2016  . 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 HTN (hypertension) 10/29/2012  . Lipid disorder 10/29/2012   Past Medical History:  Diagnosis Date  . Allergy   . Asthma   . Hyperlipidemia   . Hypertension    No past surgical history on file. No Known Allergies Prior to  Admission medications   Medication Sig Start Date End Date Taking? Authorizing Provider  albuterol (PROVENTIL HFA;VENTOLIN HFA) 108 (90 Base) MCG/ACT inhaler Inhale 1-2 puffs into the lungs every 6 (six) hours as needed for wheezing. 03/16/16   Wendie Agreste, MD  atorvastatin (LIPITOR) 10 MG tablet Take 1 tablet (10 mg total) by mouth daily. Office visit needed for additional refills. 1st notice. 04/23/17   Wendie Agreste, MD  FLUCELVAX QUADRIVALENT 0.5 ML SUSY TO BE ADMINISTERED BY PHARMACIST FOR IMMUNIZATION 10/25/16   Historical Provider, MD  lisinopril (PRINIVIL,ZESTRIL) 10 MG tablet Take 1 tablet (10 mg total) by mouth daily. Office visit needed for additional refills. 1st notice. 04/23/17   Wendie Agreste, MD   Social History   Social History  . Marital status: Single    Spouse name: N/A  . Number of children: N/A  . Years of education: N/A   Occupational History  . Not on file.   Social History Main Topics  . Smoking status: Never Smoker  . Smokeless tobacco: Not on file  . Alcohol use 0.6 oz/week    1 Glasses of wine per week  . Drug use: No  . Sexual activity: Yes   Other Topics Concern  . Not on file   Social History Narrative  . No narrative on file   Review of Systems  Constitutional: Negative for fatigue and unexpected weight change.  Eyes: Negative for visual disturbance.  Respiratory: Negative for cough, chest tightness and shortness of breath.   Cardiovascular: Negative for chest pain, palpitations and leg swelling.  Gastrointestinal: Negative for abdominal pain and blood in stool.  Allergic/Immunologic: Positive for environmental allergies.  Neurological: Negative for dizziness, light-headedness and headaches.      Objective:   Physical Exam  Constitutional: He is oriented to person, place, and time. He appears well-developed and well-nourished.  HENT:  Head: Normocephalic and atraumatic.  Nose: Right sinus exhibits no maxillary sinus tenderness and  no frontal sinus tenderness. Left sinus exhibits no maxillary sinus tenderness and no frontal sinus tenderness.  Eyes: EOM are normal. Pupils are equal, round, and reactive to light.  Neck: No JVD present. Carotid bruit is not present.  Cardiovascular: Normal rate, regular rhythm and normal heart sounds.   No murmur heard. Pulmonary/Chest: Effort normal and breath sounds normal. He has no wheezes. He has no rales.  Abdominal: There is no tenderness.  Musculoskeletal: He exhibits no edema.  Neurological: He is alert and oriented to person, place, and time.  Skin: Skin is warm and dry.  Psychiatric: He has a normal mood and affect.  Vitals reviewed.  Vitals:   05/02/17 0814 05/02/17 0831  BP: (!) 143/89 110/74  Pulse: 61   Resp: 16   Temp: 98.4 F (36.9 C)   TempSrc: Oral   SpO2: 95%   Weight: 199 lb 12.8 oz (90.6 kg)   Height: 5' 9.25" (1.759 m)       Assessment & Plan:   Curtis Berger is a 51 y.o. male Hyperlipidemia, unspecified hyperlipidemia type - Plan: Comprehensive metabolic panel, Lipid panel, atorvastatin (LIPITOR) 10 MG tablet  - Tolerating Lipitor, continue same dose, labs pending  Essential hypertension - Plan: Comprehensive metabolic panel, lisinopril (PRINIVIL,ZESTRIL) 10 MG tablet  - Tolerating lisinopril, controlled, labs pending  Hyperglycemia - Plan: Hemoglobin A1C  -Mildly elevated previously, check A1c  Mild intermittent asthma without complication - Plan: albuterol (PROVENTIL HFA;VENTOLIN HFA) 108 (90 Base) MCG/ACT inhaler  -Stable with just albuterol use during springtime. Meds refilled.  Recommended complete physical exam within the next 6-9 months.  Meds ordered this encounter  Medications  . lisinopril (PRINIVIL,ZESTRIL) 10 MG tablet    Sig: Take 1 tablet (10 mg total) by mouth daily.    Dispense:  90 tablet    Refill:  2  . atorvastatin (LIPITOR) 10 MG tablet    Sig: Take 1 tablet (10 mg total) by mouth daily.    Dispense:  90 tablet      Refill:  2  . albuterol (PROVENTIL HFA;VENTOLIN HFA) 108 (90 Base) MCG/ACT inhaler    Sig: Inhale 1-2 puffs into the lungs every 6 (six) hours as needed for wheezing.    Dispense:  1 Inhaler    Refill:  1   Patient Instructions    No change in medication doses for now. In addition to cholesterol and other electrolyte tests, I will check a 3 month blood sugar test as your blood sugar was borderline elevated previously. Follow-up within the next 6-9 months for a physical. Let me know if you have questions in the meantime.   IF you received an x-ray today, you will receive an invoice from [facility]. Please contact [facility] at [phone] with questions or concerns regarding your invoice.   IF you received labwork today, you will receive an invoice from [place]. Please contact [facility] at [phone] with questions or concerns regarding your invoice.  Our billing staff will not be able to assist you with questions regarding bills from these companies.  You will be contacted with the lab results as soon as they are available. The fastest way to get your results is to activate your My Chart account. Instructions are located on the last page of this paperwork. If you have not heard from [place] regarding the results in 2 weeks, please contact this office.      I personally performed the services described in this documentation, which was scribed in my presence. The recorded information has been reviewed and considered for accuracy and completeness, addended by me as needed, and agree with information above.  Signed,   [name], MD Primary Care at [place].  05/02/17 8:35 AM

## 2017-09-20 IMAGING — MR MR SHOULDER*R* W/O CM
4 of 5 series · 14 of 40 positions shown · non-contrast
Comparison: None.

CLINICAL DATA: Right shoulder pain and limited range of motion for
1 year. No known injury.

EXAM:
MRI OF THE RIGHT SHOULDER WITHOUT CONTRAST
TECHNIQUE: Multiplanar, multisequence MR imaging of the shoulder was performed.
No intravenous contrast was administered.

[Series 6: T2 fat-sat · oblique · right · 3.0mm · 0.44mm/px · 3 of 21 slices shown (1 of 3)]
[im 4/21]
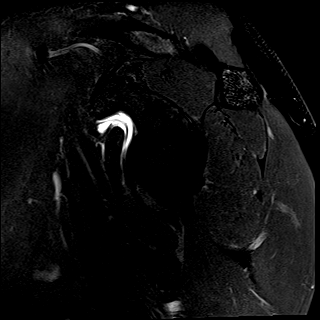
[im 11/21]
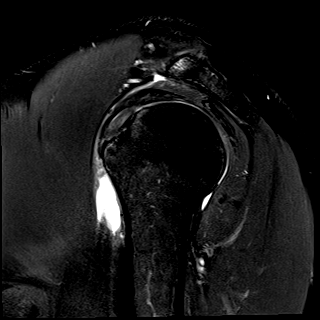
[im 17/21]
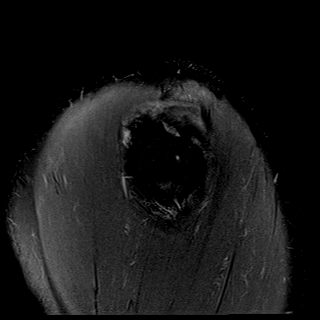

[Series 7: T2 fat-sat · axial · right · 3.0mm · 0.44mm/px · z∈[-32,+29]mm · 3 of 25 slices shown (2 of 3)]
[im 4/25]
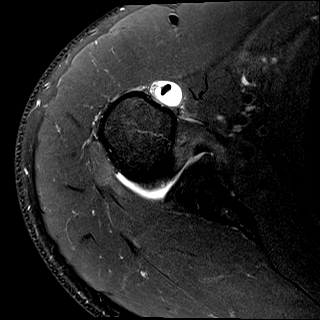
[im 13/25]
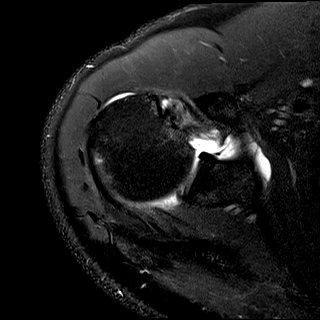
[im 22/25]
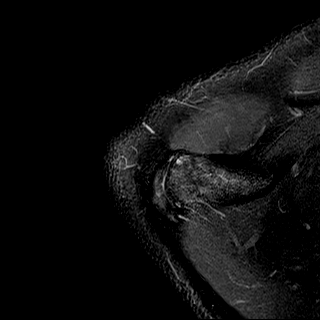

[Series 9: PD · coronal · right · 3.0mm · 0.18mm/px · 5 of 21 slices shown]
[im 1/21]
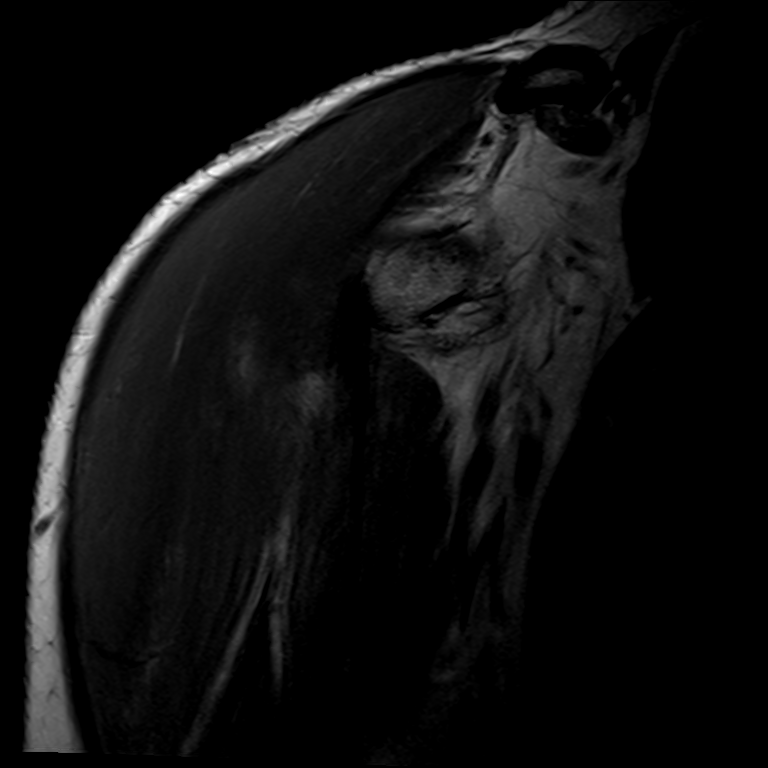
[im 3/21]
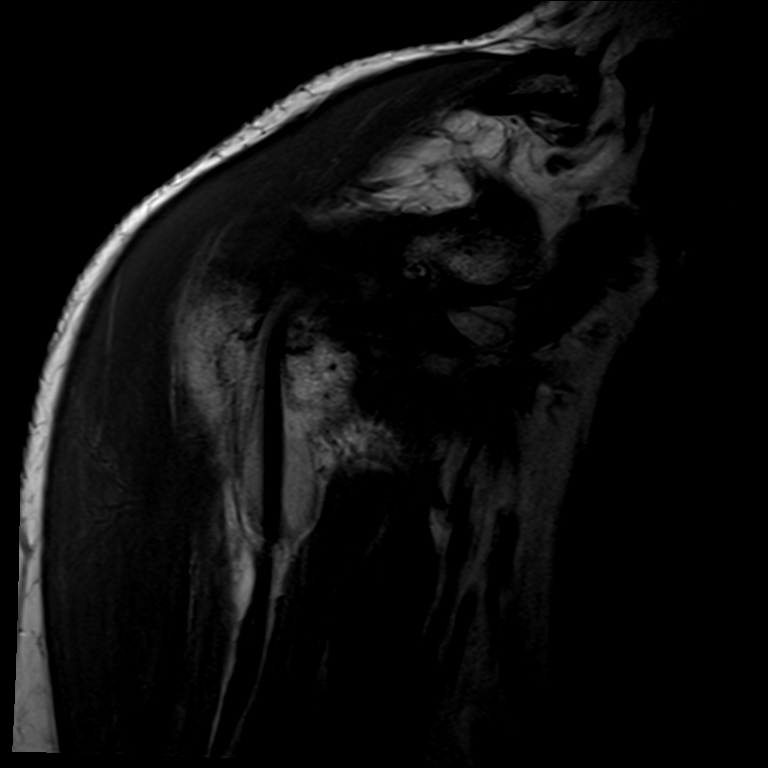
[im 6/21]
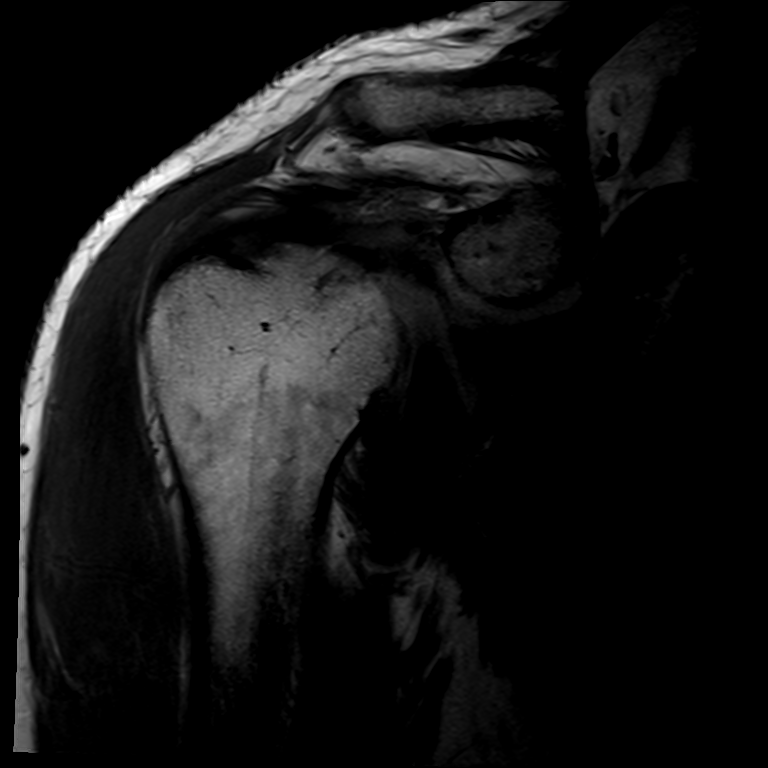
[im 12/21]
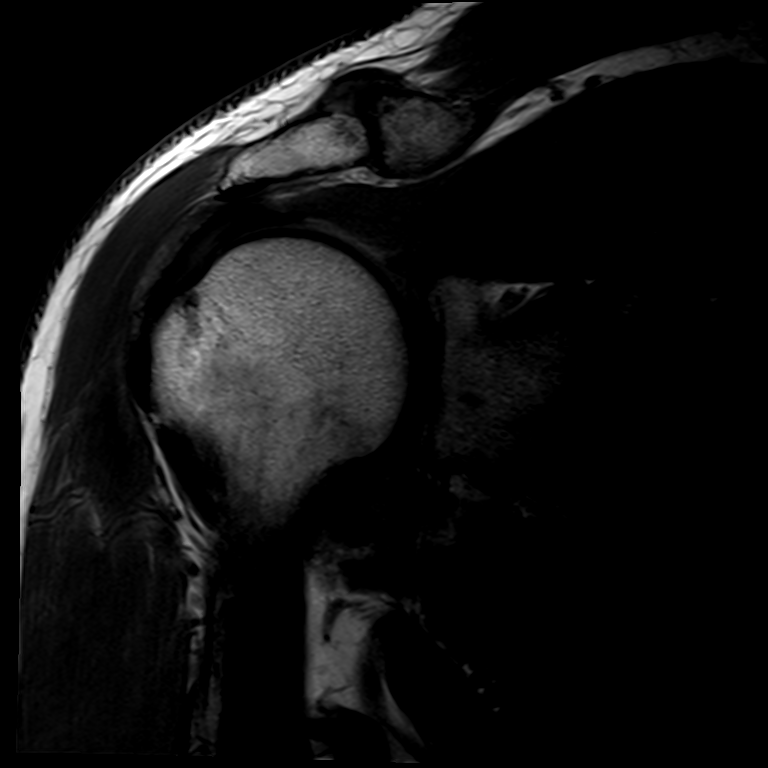
[im 18/21]
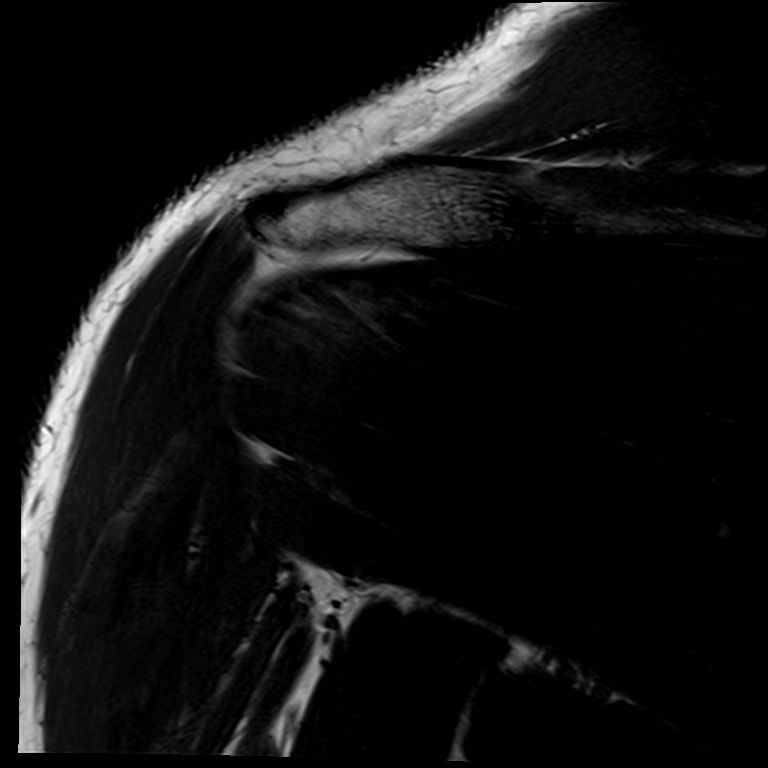

[Series 10: T2 fat-sat · coronal · right · 3.0mm · 0.22mm/px · 3 of 21 slices shown (3 of 3)]
[im 3/21]
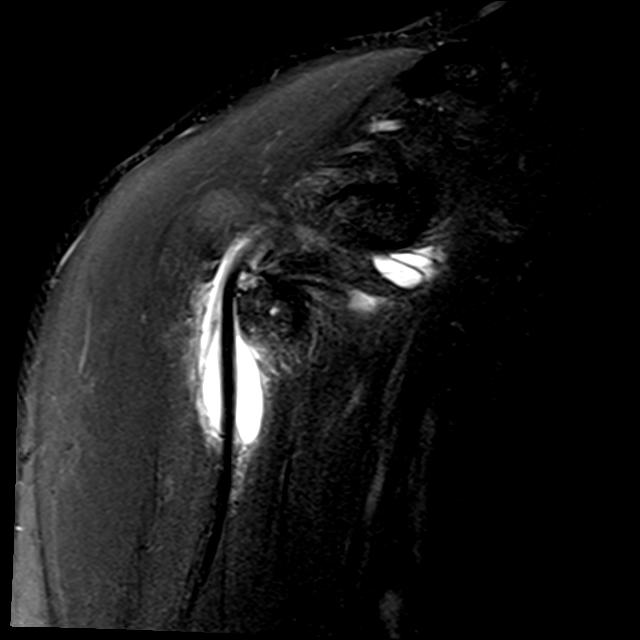
[im 12/21]
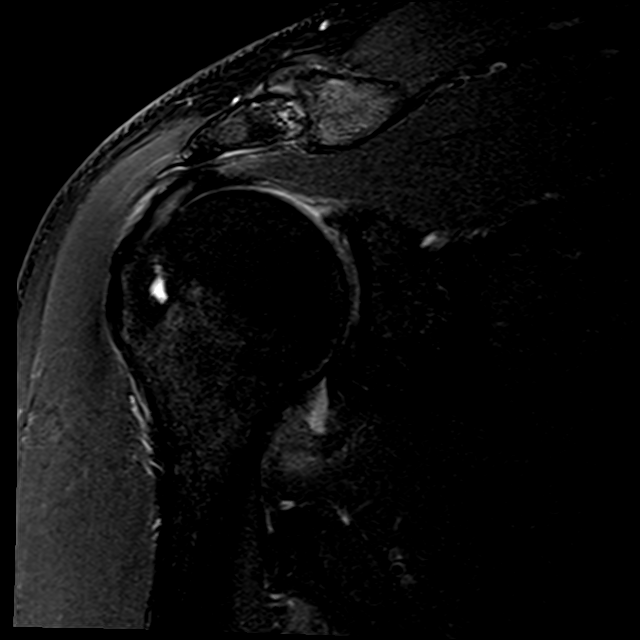
[im 18/21]
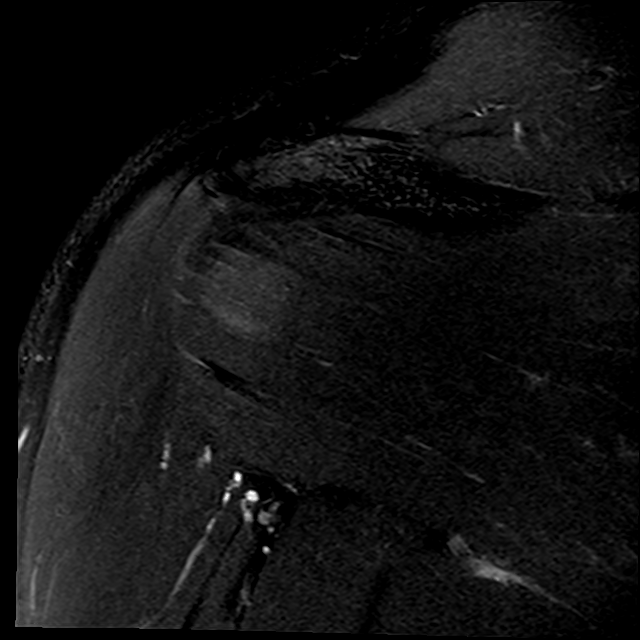

[14 of 40 positions shown; findings below may reference images not displayed]

FINDINGS: Rotator cuff: Thickening and heterogeneous increased T2 signal in
the rotator cuff tendons consistent with tendinopathy disease is
identified. No tear.

Muscles:  No atrophy or focal lesion.

Biceps long head: Intrasubstance increased T2 signal is seen in the
intra-articular tendon consistent with tendinopathy. No tear.

Acromioclavicular Joint: Degenerative change results in mass effect
on the supraspinatus muscle belly. Type 2 acromion. Small volume of
fluid is seen in the subacromial/subdeltoid bursa.

Glenohumeral Joint: The inferior glenohumeral ligament is thickened
with intermediate increased T2 signal. Otherwise unremarkable.

Labrum:  Intact.

Bones:  No fracture or worrisome lesion.

Other: None.
IMPRESSION: Rotator cuff and long head of biceps tendinopathy without tear.

Acromioclavicular osteoarthritis results in some mass effect on the
supraspinatus.

Findings compatible with adhesive capsulitis.

Subacromial/subdeltoid fluid consistent with bursitis.

## 2017-10-07 ENCOUNTER — Encounter: Payer: Self-pay | Admitting: Family Medicine

## 2017-10-07 ENCOUNTER — Ambulatory Visit (INDEPENDENT_AMBULATORY_CARE_PROVIDER_SITE_OTHER): Payer: BLUE CROSS/BLUE SHIELD | Admitting: Family Medicine

## 2017-10-07 VITALS — BP 112/84 | HR 70 | Temp 98.1°F | Resp 16 | Ht 69.25 in | Wt 199.4 lb

## 2017-10-07 DIAGNOSIS — Z1211 Encounter for screening for malignant neoplasm of colon: Secondary | ICD-10-CM | POA: Diagnosis not present

## 2017-10-07 DIAGNOSIS — Z23 Encounter for immunization: Secondary | ICD-10-CM | POA: Diagnosis not present

## 2017-10-07 DIAGNOSIS — Z125 Encounter for screening for malignant neoplasm of prostate: Secondary | ICD-10-CM

## 2017-10-07 DIAGNOSIS — Z0000 Encounter for general adult medical examination without abnormal findings: Secondary | ICD-10-CM | POA: Diagnosis not present

## 2017-10-07 DIAGNOSIS — I10 Essential (primary) hypertension: Secondary | ICD-10-CM

## 2017-10-07 DIAGNOSIS — E785 Hyperlipidemia, unspecified: Secondary | ICD-10-CM

## 2017-10-07 MED ORDER — LISINOPRIL 10 MG PO TABS: 10.0000 mg | ORAL_TABLET | Freq: Every day | ORAL | 2 refills | Status: DC

## 2017-10-07 MED ORDER — ATORVASTATIN CALCIUM 10 MG PO TABS: 10.0000 mg | ORAL_TABLET | Freq: Every day | ORAL | 2 refills | Status: DC

## 2017-10-07 NOTE — Progress Notes (Signed)
Subjective:  This chart was scribed for Wendie Agreste, MD by Tamsen Roers, at [place] at [facility]. This patient was seen in room and the patient's care was started at 10:03 AM.    Chief Complaint  Patient presents with  . Annual Exam     Patient ID: Curtis Berger, male    DOB: [birth year], 51 y.o.   MRN: [id number]  HPI HPI Comments: Curtis Berger is a 50 y.o. male who presents to Primary Care at [facility] for a physical exam. Patient has a history of hypertension, hyperlipidemia, and asthma. Last physical was January 2016.    Hypertension: Takes Lisinopril 10 mg QD. He is compliant with this medication and denies any side effects. Denies chest pains/difficulty breathing.  Lab Results  Component Value Date   CREATININE 1.05 05/02/2017    Hyperlipidemia: Takes Lipitor 10 mg QD. --- Patient is currently fasting. He is compliant with this medication and denies any side effects.  Lab Results  Component Value Date   CHOL 170 05/02/2017   HDL 46 05/02/2017   LDLCALC 104 (H) 05/02/2017   TRIG 98 05/02/2017   CHOLHDL 3.7 05/02/2017   Lab Results  Component Value Date   ALT 29 05/02/2017   AST 31 05/02/2017   ALKPHOS 67 05/02/2017   BILITOT 0.9 05/02/2017    Asthma: Episodic asthma (especially during allergy season)- has used Albuterol during this time--- He is currently using Albuterol about one time per week.     Cancer screening:   Prostate cancer screening: Patient would like to be screened for this today.   Lab Results  Component Value Date   PSA 1.00 03/16/2016   PSA 0.98 11/08/2013   PSA 1.15 10/29/2012  Colon cancer screening: Reccommended colonoscopy at his last physical.--- Patient has not received his colonoscopy. He denies a history of hemorrhoids or rectal bleeding.     Immunizations: Immunization History  Administered Date(s) Administered  . Influenza Split 10/29/2012  . Influenza,inj,Quad PF,6+ Mos 11/08/2013, 10/07/2017  .  Influenza-Unspecified 10/30/2014  . Tdap 12/31/2007  Receiving the flu vaccine today. He had Hep C and HIV screening in March 2017.     Depression:  Depression screen [facility] 2/9 10/07/2017 05/02/2017 12/03/2016 03/16/2016 01/26/2015  Decreased Interest 0 0 0 0 0  Down, Depressed, Hopeless 0 0 0 0 0  PHQ - 2 Score 0 0 0 0 0    Vision:Patient last saw his eye doctor about one year ago.   Visual Acuity Screening   Right eye Left eye Both eyes  Without correction:     With correction: 20/20 20/25 20/15       Exercise: Patient exercises 5 days per week.    Dentist: He is compliant with seeing his dentist yearly.     Patient Active Problem List   Diagnosis Date Noted  . Right shoulder pain 09/17/2016  . 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 HTN (hypertension) 10/29/2012  . Lipid disorder 10/29/2012   Past Medical History:  Diagnosis Date  . Allergy   . Asthma   . Hyperlipidemia   . Hypertension    History reviewed. No pertinent surgical history. No Known Allergies Prior to Admission medications   Medication Sig Start Date End Date Taking? Authorizing Provider  albuterol (PROVENTIL HFA;VENTOLIN HFA) 108 (90 Base) MCG/ACT inhaler Inhale 1-2 puffs into the lungs every 6 (six) hours as needed for wheezing. 05/02/17   Wendie Agreste, MD  atorvastatin (LIPITOR) 10 MG tablet  Take 1 tablet (10 mg total) by mouth daily. 05/02/17   Wendie Agreste, MD  lisinopril (PRINIVIL,ZESTRIL) 10 MG tablet Take 1 tablet (10 mg total) by mouth daily. 05/02/17   Wendie Agreste, MD   Social History   Social History  . Marital status: Single    Spouse name: N/A  . Number of children: N/A  . Years of education: N/A   Occupational History  . Not on file.   Social History Main Topics  . Smoking status: Never Smoker  . Smokeless tobacco: Never Used  . Alcohol use 0.6 oz/week    1 Glasses of wine per week  . Drug use: No  . Sexual activity: Yes   Other Topics  Concern  . Not on file   Social History Narrative  . No narrative on file      Review of Systems  All other systems reviewed and are negative.      Objective:   Physical Exam  Constitutional: He is oriented to person, place, and time. He appears well-developed and well-nourished.  HENT:  Head: Normocephalic and atraumatic.  Right Ear: External ear normal.  Left Ear: External ear normal.  Mouth/Throat: Oropharynx is clear and moist.  Eyes: Pupils are equal, round, and reactive to light. Conjunctivae and EOM are normal.  Neck: Normal range of motion. Neck supple. No thyromegaly present.  Cardiovascular: Normal rate, regular rhythm, normal heart sounds and intact distal pulses.   Pulmonary/Chest: Effort normal and breath sounds normal. No respiratory distress. He has no wheezes.  Abdominal: Soft. He exhibits no distension. There is no tenderness. Hernia confirmed negative in the right inguinal area and confirmed negative in the left inguinal area.  Genitourinary: Prostate normal.  Musculoskeletal: Normal range of motion. He exhibits no edema or tenderness.  Lymphadenopathy:    He has no cervical adenopathy.  Neurological: He is alert and oriented to person, place, and time. He has normal reflexes.  Skin: Skin is warm and dry.  Psychiatric: He has a normal mood and affect. His behavior is normal.  Vitals reviewed.    Vitals:   10/07/17 0940  BP: 112/84  Pulse: 70  Resp: 16  Temp: 98.1 F (36.7 C)  SpO2: 99%  Weight: 199 lb 6.4 oz (90.4 kg)  Height: 5' 9.25" (1.759 m)       Assessment & Plan:   Curtis Berger is a 50 y.o. male Annual physical exam  - -anticipatory guidance as below in AVS, screening labs above. Health maintenance items as above in HPI discussed/recommended as applicable.   Need for prophylactic vaccination and inoculation against influenza - Plan: Flu Vaccine QUAD 6+ mos PF IM (Fluarix Quad PF)  Hyperlipidemia, unspecified hyperlipidemia type  - Plan: atorvastatin (LIPITOR) 10 MG tablet, Lipid panel  - Tolerating Lipitor, check labs, no change in doses at present.  Essential hypertension - Plan: lisinopril (PRINIVIL,ZESTRIL) 10 MG tablet, Comprehensive metabolic panel  -Stable, tolerating medication, continue lisinopril 10 mg daily, CMP pending  Screen for colon cancer - Plan: Ambulatory referral to Gastroenterology  -Stress importance of colon cancer screening, agrees to follow-up with GI, referral placed.  Screening for prostate cancer - Plan: PSA  - We discussed pros and cons of prostate cancer screening, and after this discussion, he chose to have screening done. PSA obtained, and no concerning findings on DRE.    Meds ordered this encounter  Medications  . atorvastatin (LIPITOR) 10 MG tablet    Sig: Take 1 tablet (10 mg  total) by mouth daily.    Dispense:  90 tablet    Refill:  2  . lisinopril (PRINIVIL,ZESTRIL) 10 MG tablet    Sig: Take 1 tablet (10 mg total) by mouth daily.    Dispense:  90 tablet    Refill:  2   Patient Instructions   Thanks for coming in today. I do recommend colonoscopy for colon cancer screening. I will place another referral to gastroenterology. No change in medications at this time, I will check some blood work and let you know the results of the next 2 weeks. Follow-up in the next 6-9 months to recheck blood pressure and cholesterol. Keeping you healthy  Get these tests  Blood pressure- Have your blood pressure checked once a year by your healthcare provider.  Normal blood pressure is 120/80  Weight- Have your body mass index (BMI) calculated to screen for obesity.  BMI is a measure of body fat based on height and weight. You can also calculate your own BMI at [email or web address].  Cholesterol- Have your cholesterol checked every year.  Diabetes- Have your blood sugar checked regularly if you have high blood pressure, high cholesterol, have a family history of diabetes or if you  are overweight.  Screening for Colon Cancer- Colonoscopy starting at age 84.  Screening may begin sooner depending on your family history and other health conditions. Follow up colonoscopy as directed by your Gastroenterologist.  Screening for Prostate Cancer- Both blood work (PSA) and a rectal exam help screen for Prostate Cancer.  Screening begins at age 67 with African-American men and at age 53 with Caucasian men.  Screening may begin sooner depending on your family history.  Take these medicines  Aspirin- One aspirin daily can help prevent Heart disease and Stroke.  Flu shot- Every fall.  Tetanus- Every 10 years.  Zostavax- Once after the age of 19 to prevent Shingles.  Pneumonia shot- Once after the age of 67; if you are younger than 33, ask your healthcare provider if you need a Pneumonia shot.  Take these steps  Don't smoke- If you do smoke, talk to your doctor about quitting.  For tips on how to quit, go to www.smokefree.gov or call 1-800-QUIT-NOW.  Be physically active- Exercise 5 days a week for at least 30 minutes.  If you are not already physically active start slow and gradually work up to 30 minutes of moderate physical activity.  Examples of moderate activity include walking briskly, mowing the yard, dancing, swimming, bicycling, etc.  Eat a healthy diet- Eat a variety of healthy food such as fruits, vegetables, low fat milk, low fat cheese, yogurt, lean meant, poultry, fish, beans, tofu, etc. For more information go to www.thenutritionsource.org  Drink alcohol in moderation- Limit alcohol intake to less than two drinks a day. Never drink and drive.  Dentist- Brush and floss twice daily; visit your dentist twice a year.  Depression- Your emotional health is as important as your physical health. If you're feeling down, or losing interest in things you would normally enjoy please talk to your healthcare provider.  Eye exam- Visit your eye doctor every year.  Safe sex-  If you may be exposed to a sexually transmitted infection, use a condom.  Seat belts- Seat belts can save your life; always wear one.  Smoke/Carbon Monoxide detectors- These detectors need to be installed on the appropriate level of your home.  Replace batteries at least once a year.  Skin cancer- When out in the sun, cover up  and use sunscreen 15 SPF or higher.  Violence- If anyone is threatening you, please tell your healthcare provider.  Living Will/ Health care power of attorney- Speak with your healthcare provider and family.   IF you received an x-ray today, you will receive an invoice from [facility]. Please contact [facility] at [phone] with questions or concerns regarding your invoice.   IF you received labwork today, you will receive an invoice from [place]. Please contact [facility] at [phone] with questions or concerns regarding your invoice.   Our billing staff will not be able to assist you with questions regarding bills from these companies.  You will be contacted with the lab results as soon as they are available. The fastest way to get your results is to activate your My Chart account. Instructions are located on the last page of this paperwork. If you have not heard from [place] regarding the results in 2 weeks, please contact this office.       I personally performed the services described in this documentation, which was scribed in my presence. The recorded information has been reviewed and considered for accuracy and completeness, addended by me as needed, and agree with information above.  Signed,   [name], MD Primary [facility] at [place].  10/10/17 6:21 PM

## 2017-10-07 NOTE — Patient Instructions (Addendum)
Thanks for coming in today. I do recommend colonoscopy for colon cancer screening. I will place another referral to gastroenterology. No change in medications at this time, I will check some blood work and let you know the results of the next 2 weeks. Follow-up in the next 6-9 months to recheck blood pressure and cholesterol. Keeping you healthy  Get these tests  Blood pressure- Have your blood pressure checked once a year by your healthcare provider.  Normal blood pressure is 120/80  Weight- Have your body mass index (BMI) calculated to screen for obesity.  BMI is a measure of body fat based on height and weight. You can also calculate your own BMI at [email or web address].  Cholesterol- Have your cholesterol checked every year.  Diabetes- Have your blood sugar checked regularly if you have high blood pressure, high cholesterol, have a family history of diabetes or if you are overweight.  Screening for Colon Cancer- Colonoscopy starting at age 14.  Screening may begin sooner depending on your family history and other health conditions. Follow up colonoscopy as directed by your Gastroenterologist.  Screening for Prostate Cancer- Both blood work (PSA) and a rectal exam help screen for Prostate Cancer.  Screening begins at age 68 with African-American men and at age 27 with Caucasian men.  Screening may begin sooner depending on your family history.  Take these medicines  Aspirin- One aspirin daily can help prevent Heart disease and Stroke.  Flu shot- Every fall.  Tetanus- Every 10 years.  Zostavax- Once after the age of 69 to prevent Shingles.  Pneumonia shot- Once after the age of 84; if you are younger than 88, ask your healthcare provider if you need a Pneumonia shot.  Take these steps  Don't smoke- If you do smoke, talk to your doctor about quitting.  For tips on how to quit, go to www.smokefree.gov or call 1-800-QUIT-NOW.  Be physically active- Exercise 5 days a week for at  least 30 minutes.  If you are not already physically active start slow and gradually work up to 30 minutes of moderate physical activity.  Examples of moderate activity include walking briskly, mowing the yard, dancing, swimming, bicycling, etc.  Eat a healthy diet- Eat a variety of healthy food such as fruits, vegetables, low fat milk, low fat cheese, yogurt, lean meant, poultry, fish, beans, tofu, etc. For more information go to www.thenutritionsource.org  Drink alcohol in moderation- Limit alcohol intake to less than two drinks a day. Never drink and drive.  Dentist- Brush and floss twice daily; visit your dentist twice a year.  Depression- Your emotional health is as important as your physical health. If you're feeling down, or losing interest in things you would normally enjoy please talk to your healthcare provider.  Eye exam- Visit your eye doctor every year.  Safe sex- If you may be exposed to a sexually transmitted infection, use a condom.  Seat belts- Seat belts can save your life; always wear one.  Smoke/Carbon Monoxide detectors- These detectors need to be installed on the appropriate level of your home.  Replace batteries at least once a year.  Skin cancer- When out in the sun, cover up and use sunscreen 15 SPF or higher.  Violence- If anyone is threatening you, please tell your healthcare provider.  Living Will/ Health care power of attorney- Speak with your healthcare provider and family.   IF you received an x-ray today, you will receive an invoice from [facility]. Please contact [facility] at [phone] with  questions or concerns regarding your invoice.   IF you received labwork today, you will receive an invoice from [place]. Please contact [facility] at [phone] with questions or concerns regarding your invoice.   Our billing staff will not be able to assist you with questions regarding bills from these companies.  You will be contacted with  the lab results as soon as they are available. The fastest way to get your results is to activate your My Chart account. Instructions are located on the last page of this paperwork. If you have not heard from [place] regarding the results in 2 weeks, please contact this office.

## 2017-10-08 LAB — COMPREHENSIVE METABOLIC PANEL
ALBUMIN: 4.8 g/dL (ref 3.5–5.5)
ALT: 27 IU/L (ref 0–44)
AST: 28 IU/L (ref 0–40)
Albumin/Globulin Ratio: 2.2 (ref 1.2–2.2)
Alkaline Phosphatase: 72 IU/L (ref 39–117)
BUN/Creatinine Ratio: 15 (ref 9–20)
BUN: 16 mg/dL (ref 6–24)
Bilirubin Total: 0.6 mg/dL (ref 0.0–1.2)
CALCIUM: 9.9 mg/dL (ref 8.7–10.2)
CO2: 22 mmol/L (ref 20–29)
CREATININE: 1.1 mg/dL (ref 0.76–1.27)
Chloride: 98 mmol/L (ref 96–106)
GFR, EST AFRICAN AMERICAN: 88 mL/min/{1.73_m2} (ref >59)
GFR, EST NON AFRICAN AMERICAN: 76 mL/min/{1.73_m2} (ref >59)
GLUCOSE: 91 mg/dL (ref 65–99)
Globulin, Total: 2.2 g/dL (ref 1.5–4.5)
Potassium: 4.5 mmol/L (ref 3.5–5.2)
Sodium: 137 mmol/L (ref 134–144)
TOTAL PROTEIN: 7 g/dL (ref 6.0–8.5)

## 2017-10-08 LAB — PSA: PROSTATE SPECIFIC AG, SERUM: 1.3 ng/mL (ref 0.0–4.0)

## 2017-10-08 LAB — LIPID PANEL
CHOL/HDL RATIO: 3.5 ratio (ref 0.0–5.0)
Cholesterol, Total: 189 mg/dL (ref 100–199)
HDL: 54 mg/dL (ref >39)
LDL CALC: 99 mg/dL (ref 0–99)
Triglycerides: 180 mg/dL — ABNORMAL HIGH (ref 0–149)
VLDL CHOLESTEROL CAL: 36 mg/dL (ref 5–40)

## 2017-12-10 ENCOUNTER — Encounter: Payer: Self-pay | Admitting: Family Medicine

## 2018-10-23 ENCOUNTER — Encounter: Payer: Self-pay | Admitting: Family Medicine

## 2018-10-23 ENCOUNTER — Ambulatory Visit (INDEPENDENT_AMBULATORY_CARE_PROVIDER_SITE_OTHER): Payer: BLUE CROSS/BLUE SHIELD | Admitting: Family Medicine

## 2018-10-23 ENCOUNTER — Other Ambulatory Visit: Payer: Self-pay

## 2018-10-23 VITALS — BP 124/74 | HR 54 | Temp 98.0°F | Resp 16 | Ht 69.5 in | Wt 197.0 lb

## 2018-10-23 DIAGNOSIS — Z125 Encounter for screening for malignant neoplasm of prostate: Secondary | ICD-10-CM

## 2018-10-23 DIAGNOSIS — Z0000 Encounter for general adult medical examination without abnormal findings: Secondary | ICD-10-CM

## 2018-10-23 DIAGNOSIS — I10 Essential (primary) hypertension: Secondary | ICD-10-CM

## 2018-10-23 DIAGNOSIS — Z23 Encounter for immunization: Secondary | ICD-10-CM

## 2018-10-23 DIAGNOSIS — J4520 Mild intermittent asthma, uncomplicated: Secondary | ICD-10-CM

## 2018-10-23 DIAGNOSIS — J342 Deviated nasal septum: Secondary | ICD-10-CM

## 2018-10-23 DIAGNOSIS — E785 Hyperlipidemia, unspecified: Secondary | ICD-10-CM

## 2018-10-23 MED ORDER — ALBUTEROL SULFATE HFA 108 (90 BASE) MCG/ACT IN AERS: 1.0000 | INHALATION_SPRAY | Freq: Four times a day (QID) | RESPIRATORY_TRACT | 1 refills | Status: DC | PRN

## 2018-10-23 MED ORDER — LISINOPRIL 10 MG PO TABS: 10.0000 mg | ORAL_TABLET | Freq: Every day | ORAL | 3 refills | Status: DC

## 2018-10-23 MED ORDER — ATORVASTATIN CALCIUM 10 MG PO TABS: 10.0000 mg | ORAL_TABLET | Freq: Every day | ORAL | 3 refills | Status: DC

## 2018-10-23 NOTE — Patient Instructions (Addendum)
Call GI to schedule colonoscopy. Let me know if a new referral is needed. [phone]  No med changes at this time.   Return to the clinic if any new side effects.   I will refer you to ENT to look into treatment for septum irregularity.  Keeping you healthy  Get these tests  Blood pressure- Have your blood pressure checked once a year by your healthcare provider.  Normal blood pressure is 120/80  Weight- Have your body mass index (BMI) calculated to screen for obesity.  BMI is a measure of body fat based on height and weight. You can also calculate your own BMI at [email or web address].  Cholesterol- Have your cholesterol checked every year.  Diabetes- Have your blood sugar checked regularly if you have high blood pressure, high cholesterol, have a family history of diabetes or if you are overweight.  Screening for Colon Cancer- Colonoscopy starting at age 4.  Screening may begin sooner depending on your family history and other health conditions. Follow up colonoscopy as directed by your Gastroenterologist.  Screening for Prostate Cancer- Both blood work (PSA) and a rectal exam help screen for Prostate Cancer.  Screening begins at age 69 with African-American men and at age 44 with Caucasian men.  Screening may begin sooner depending on your family history.  Take these medicines  Aspirin- One aspirin daily can help prevent Heart disease and Stroke.  Flu shot- Every fall.  Tetanus- Every 10 years.  Zostavax- Once after the age of 74 to prevent Shingles.  Pneumonia shot- Once after the age of 80; if you are younger than 16, ask your healthcare provider if you need a Pneumonia shot.  Take these steps  Don't smoke- If you do smoke, talk to your doctor about quitting.  For tips on how to quit, go to www.smokefree.gov or call 1-800-QUIT-NOW.  Be physically active- Exercise 5 days a week for at least 30 minutes.  If you are not already physically active start slow and gradually  work up to 30 minutes of moderate physical activity.  Examples of moderate activity include walking briskly, mowing the yard, dancing, swimming, bicycling, etc.  Eat a healthy diet- Eat a variety of healthy food such as fruits, vegetables, low fat milk, low fat cheese, yogurt, lean meant, poultry, fish, beans, tofu, etc. For more information go to www.thenutritionsource.org  Drink alcohol in moderation- Limit alcohol intake to less than two drinks a day. Never drink and drive.  Dentist- Brush and floss twice daily; visit your dentist twice a year.  Depression- Your emotional health is as important as your physical health. If you're feeling down, or losing interest in things you would normally enjoy please talk to your healthcare provider.  Eye exam- Visit your eye doctor every year.  Safe sex- If you may be exposed to a sexually transmitted infection, use a condom.  Seat belts- Seat belts can save your life; always wear one.  Smoke/Carbon Monoxide detectors- These detectors need to be installed on the appropriate level of your home.  Replace batteries at least once a year.  Skin cancer- When out in the sun, cover up and use sunscreen 15 SPF or higher.  Violence- If anyone is threatening you, please tell your healthcare provider.  Living Will/ Health care power of attorney- Speak with your healthcare provider and family.    If you have lab work done today you will be contacted with your lab results within the next 2 weeks.  If you have not heard from  [place] then please contact us. The fastest way to get your results is to register for My Chart.   IF you received an x-ray today, you will receive an invoice from [facility]. Please contact [facility] at [phone] with questions or concerns regarding your invoice.   IF you received labwork today, you will receive an invoice from [place]. Please contact [facility] at [phone] with questions or concerns regarding your  invoice.   Our billing staff will not be able to assist you with questions regarding bills from these companies.  You will be contacted with the lab results as soon as they are available. The fastest way to get your results is to activate your My Chart account. Instructions are located on the last page of this paperwork. If you have not heard from [place] regarding the results in 2 weeks, please contact this office.

## 2018-10-23 NOTE — Progress Notes (Signed)
Subjective:  By signing my name below, I, [name], attest that this documentation has been prepared under the direction and in the presence of Curtis Ray, MD. Electronically Signed: [name], [place]. 10/23/2018 , 2:05 PM .  Patient was seen in Room 2 .   Patient ID: Curtis Berger, male    DOB: 1966/06/08, 52 y.o.   MRN: [id number] Chief Complaint  Patient presents with  . Annual Exam  . Medication Refill    Albuterol inhaler, Atorvastatin 10 mg, Lisinoopril 10 mg (pt request a 90 day on each Rx)   HPI HAIG Berger is a 52 y.o. male Here for annual physical. He has a history of HTN, and hyperlipidemia, last physical in Oct 2018. He ate a protein bar this morning.   HTN He takes lisinopril 10 mg qd; continued on same dose last year.   BP Readings from Last 3 Encounters:  10/23/18 124/74  10/07/17 112/84  05/02/17 110/74   Lab Results  Component Value Date   CREATININE 1.10 10/07/2017   He checks his BP at home occasionally, around 120s/70s. He denies any daily coughs with the lisinopril. He denies lightheadedness, dizziness, chest tightness or shortness of breath.   Hyperlipidemia Lab Results  Component Value Date   CHOL 189 10/07/2017   HDL 54 10/07/2017   LDLCALC 99 10/07/2017   TRIG 180 (H) 10/07/2017   CHOLHDL 3.5 10/07/2017   Lab Results  Component Value Date   ALT 27 10/07/2017   AST 28 10/07/2017   ALKPHOS 72 10/07/2017   BILITOT 0.6 10/07/2017   He takes Lipitor 10 mg qd. Borderline triglycerides but continued on same regime.   Seasonal allergies He requested refill of albuterol inhaler, usually uses it for spring time allergies. He denies using it regularly.   Cancer Screening Colonoscopy: discuss importance of colonoscopy at his physical last year, as well as previous visit; and was referred to GI in 2016 and then 2018. Note on 12/10/17, unable to contact patient to schedule. Number provided for patient to schedule at his  convenience today.   Prostate cancer screening: Lab Results  Component Value Date   PSA1 1.3 10/07/2017    Immunizations Immunization History  Administered Date(s) Administered  . Influenza Split 10/29/2012  . Influenza,inj,Quad PF,6+ Mos 11/08/2013, 10/07/2017  . Influenza-Unspecified 10/30/2014  . Tdap 12/31/2007   Due for repeat Tetanus today: Tdap updated today.  Flu shot: updated today.   Depression Depression screen [facility] 2/9 10/23/2018 10/07/2017 05/02/2017 12/03/2016 03/16/2016  Decreased Interest 0 0 0 0 0  Down, Depressed, Hopeless 0 0 0 0 0  PHQ - 2 Score 0 0 0 0 0     Vision  Visual Acuity Screening   Right eye Left eye Both eyes  Without correction:     With correction: 20/13 20/15 20/13    Eye doctor: he's seen annually; last seen a year ago, and will in next few weeks.   Dentist He's seen every 6 months by dentist.   Exercise He goes to 8 classes a week.    Patient Active Problem List   Diagnosis Date Noted  . Right shoulder pain 09/17/2016  . 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 HTN (hypertension) 10/29/2012  . Lipid disorder 10/29/2012   Past Medical History:  Diagnosis Date  . Allergy   . Asthma   . Hyperlipidemia   . Hypertension    No past surgical history on file. No Known Allergies Prior to Admission  medications   Medication Sig Start Date End Date Taking? Authorizing Provider  albuterol (PROVENTIL HFA;VENTOLIN HFA) 108 (90 Base) MCG/ACT inhaler Inhale 1-2 puffs into the lungs every 6 (six) hours as needed for wheezing. 05/02/17  Yes Wendie Agreste, MD  atorvastatin (LIPITOR) 10 MG tablet Take 1 tablet (10 mg total) by mouth daily. 10/07/17  Yes Wendie Agreste, MD  lisinopril (PRINIVIL,ZESTRIL) 10 MG tablet Take 1 tablet (10 mg total) by mouth daily. 10/07/17  Yes Wendie Agreste, MD   Social History   Socioeconomic History  . Marital status: Single    Spouse name: Not on file  . Number of children:  Not on file  . Years of education: Not on file  . Highest education level: Not on file  Occupational History  . Not on file  Social Needs  . Financial resource strain: Not on file  . Food insecurity:    Worry: Not on file    Inability: Not on file  . Transportation needs:    Medical: Not on file    Non-medical: Not on file  Tobacco Use  . Smoking status: Never Smoker  . Smokeless tobacco: Never Used  Substance and Sexual Activity  . Alcohol use: Yes    Alcohol/week: 1.0 standard drinks    Types: 1 Glasses of wine per week  . Drug use: No  . Sexual activity: Yes  Lifestyle  . Physical activity:    Days per week: Not on file    Minutes per session: Not on file  . Stress: Not on file  Relationships  . Social connections:    Talks on phone: Not on file    Gets together: Not on file    Attends religious service: Not on file    Active member of club or organization: Not on file    Attends meetings of clubs or organizations: Not on file    Relationship status: Not on file  . Intimate partner violence:    Fear of current or ex partner: Not on file    Emotionally abused: Not on file    Physically abused: Not on file    Forced sexual activity: Not on file  Other Topics Concern  . Not on file  Social History Narrative  . Not on file    Review of Systems 13 point ROS - negative     Objective:   Physical Exam  Constitutional: He is oriented to person, place, and time. He appears well-developed and well-nourished.  HENT:  Head: Normocephalic and atraumatic.  Right Ear: External ear normal.  Left Ear: External ear normal.  Mouth/Throat: Oropharynx is clear and moist.  prominence at the nasal septum  Eyes: Pupils are equal, round, and reactive to light. Conjunctivae and EOM are normal.  Neck: Normal range of motion. Neck supple. No thyromegaly present.  Cardiovascular: Normal rate, regular rhythm, normal heart sounds and intact distal pulses.  Pulmonary/Chest: Effort  normal and breath sounds normal. No respiratory distress. He has no wheezes.  Abdominal: Soft. He exhibits no distension. There is no tenderness. Hernia confirmed negative in the right inguinal area and confirmed negative in the left inguinal area.  Genitourinary: Prostate normal.  Musculoskeletal: Normal range of motion. He exhibits no edema or tenderness.  Lymphadenopathy:    He has no cervical adenopathy.  Neurological: He is alert and oriented to person, place, and time. He has normal reflexes.  Skin: Skin is warm and dry.  Psychiatric: He has a normal mood and  affect. His behavior is normal.  Vitals reviewed.   Vitals:   10/23/18 1333 10/23/18 1339  BP: (!) 149/90 124/74  Pulse: (!) 55 (!) 54  Resp: 16   Temp: 98 F (36.7 C)   TempSrc: Oral   SpO2: 99%   Weight: 197 lb (89.4 kg)   Height: 5' 9.5" (1.765 m)        Assessment & Plan:   CANE DUBRAY is a 52 y.o. male Annual physical exam  - -anticipatory guidance as below in AVS, screening labs above. Health maintenance items as above in HPI discussed/recommended as applicable.   Mild intermittent asthma without complication - Plan: albuterol (PROVENTIL HFA;VENTOLIN HFA) 108 (90 Base) MCG/ACT inhaler, DISCONTINUED: albuterol (PROVENTIL HFA;VENTOLIN HFA) 108 (90 Base) MCG/ACT inhaler  -Overall stable control, continue albuterol for breakthrough symptoms, especially springtime.   Hyperlipidemia, unspecified hyperlipidemia type - Plan: Comprehensive metabolic panel, Lipid panel, atorvastatin (LIPITOR) 10 MG tablet  -Tolerating Lipitor, check labs.  Continue same dose  Essential hypertension - Plan: Comprehensive metabolic panel, lisinopril (PRINIVIL,ZESTRIL) 10 MG tablet  -Stable on recheck, no changes, pending labs  Nasal septal deformity - Plan: Ambulatory referral to ENT  -Prominence of the right nasal septum, long-standing.  Refer to ENT discussed options versus plastic surgery.  Screening for prostate cancer -  Plan: PSA  - We discussed pros and cons of prostate cancer screening, and after this discussion, he chose to have screening done. PSA obtained, and no concerning findings on DRE.   Need for Tdap vaccination - Plan: Tdap vaccine greater than or equal to 7yo IM  Flu vaccine need - Plan: Flu Vaccine QUAD 36+ mos IM   Meds ordered this encounter  Medications  . DISCONTD: albuterol (PROVENTIL HFA;VENTOLIN HFA) 108 (90 Base) MCG/ACT inhaler    Sig: Inhale 1-2 puffs into the lungs every 6 (six) hours as needed for wheezing.    Dispense:  1 Inhaler    Refill:  1  . atorvastatin (LIPITOR) 10 MG tablet    Sig: Take 1 tablet (10 mg total) by mouth daily.    Dispense:  90 tablet    Refill:  3  . lisinopril (PRINIVIL,ZESTRIL) 10 MG tablet    Sig: Take 1 tablet (10 mg total) by mouth daily.    Dispense:  90 tablet    Refill:  3  . albuterol (PROVENTIL HFA;VENTOLIN HFA) 108 (90 Base) MCG/ACT inhaler    Sig: Inhale 1-2 puffs into the lungs every 6 (six) hours as needed for wheezing.    Dispense:  1 Inhaler    Refill:  1   Patient Instructions   Call GI to schedule colonoscopy. Let me know if a new referral is needed. [phone]  No med changes at this time.   Return to the clinic if any new side effects.   I will refer you to ENT to look into treatment for septum irregularity.  Keeping you healthy  Get these tests  Blood pressure- Have your blood pressure checked once a year by your healthcare provider.  Normal blood pressure is 120/80  Weight- Have your body mass index (BMI) calculated to screen for obesity.  BMI is a measure of body fat based on height and weight. You can also calculate your own BMI at ViewBanking.si.  Cholesterol- Have your cholesterol checked every year.  Diabetes- Have your blood sugar checked regularly if you have high blood pressure, high cholesterol, have a family history of diabetes or if you are overweight.  Screening for  Colon Cancer- Colonoscopy  starting at age 35.  Screening may begin sooner depending on your family history and other health conditions. Follow up colonoscopy as directed by your Gastroenterologist.  Screening for Prostate Cancer- Both blood work (PSA) and a rectal exam help screen for Prostate Cancer.  Screening begins at age 67 with African-American men and at age 62 with Caucasian men.  Screening may begin sooner depending on your family history.  Take these medicines  Aspirin- One aspirin daily can help prevent Heart disease and Stroke.  Flu shot- Every fall.  Tetanus- Every 10 years.  Zostavax- Once after the age of 21 to prevent Shingles.  Pneumonia shot- Once after the age of 18; if you are younger than 34, ask your healthcare provider if you need a Pneumonia shot.  Take these steps  Don't smoke- If you do smoke, talk to your doctor about quitting.  For tips on how to quit, go to www.smokefree.gov or call 1-800-QUIT-NOW.  Be physically active- Exercise 5 days a week for at least 30 minutes.  If you are not already physically active start slow and gradually work up to 30 minutes of moderate physical activity.  Examples of moderate activity include walking briskly, mowing the yard, dancing, swimming, bicycling, etc.  Eat a healthy diet- Eat a variety of healthy food such as fruits, vegetables, low fat milk, low fat cheese, yogurt, lean meant, poultry, fish, beans, tofu, etc. For more information go to www.thenutritionsource.org  Drink alcohol in moderation- Limit alcohol intake to less than two drinks a day. Never drink and drive.  Dentist- Brush and floss twice daily; visit your dentist twice a year.  Depression- Your emotional health is as important as your physical health. If you're feeling down, or losing interest in things you would normally enjoy please talk to your healthcare provider.  Eye exam- Visit your eye doctor every year.  Safe sex- If you may be exposed to a sexually transmitted infection,  use a condom.  Seat belts- Seat belts can save your life; always wear one.  Smoke/Carbon Monoxide detectors- These detectors need to be installed on the appropriate level of your home.  Replace batteries at least once a year.  Skin cancer- When out in the sun, cover up and use sunscreen 15 SPF or higher.  Violence- If anyone is threatening you, please tell your healthcare provider.  Living Will/ Health care power of attorney- Speak with your healthcare provider and family.    If you have lab work done today you will be contacted with your lab results within the next 2 weeks.  If you have not heard from [place] then please contact us. The fastest way to get your results is to register for My Chart.   IF you received an x-[place] today, you will receive an invoice from [facility]. Please contact [facility] at [phone] with questions or concerns regarding your invoice.   IF you received labwork today, you will receive an invoice from [place]. Please contact [facility] at [phone] with questions or concerns regarding your invoice.   Our billing staff will not be able to assist you with questions regarding bills from these companies.  You will be contacted with the lab results as soon as they are available. The fastest way to get your results is to activate your My Chart account. Instructions are located on the last page of this paperwork. If you have not heard from [place] regarding the results in 2 weeks, please contact this office.  I personally performed the services described in this documentation, which was scribed in my presence. The recorded information has been reviewed and considered for accuracy and completeness, addended by me as needed, and agree with information above.  Signed,   Curtis Ray, MD [facility].  10/23/18 10:52 PM

## 2018-10-24 LAB — LIPID PANEL
CHOLESTEROL TOTAL: 191 mg/dL (ref 100–199)
Chol/HDL Ratio: 4 ratio (ref 0.0–5.0)
HDL: 48 mg/dL (ref >39)
LDL CALC: 115 mg/dL — AB (ref 0–99)
TRIGLYCERIDES: 141 mg/dL (ref 0–149)
VLDL CHOLESTEROL CAL: 28 mg/dL (ref 5–40)

## 2018-10-24 LAB — PSA: Prostate Specific Ag, Serum: 1.2 ng/mL (ref 0.0–4.0)

## 2018-10-24 LAB — COMPREHENSIVE METABOLIC PANEL
ALT: 46 IU/L — ABNORMAL HIGH (ref 0–44)
AST: 45 IU/L — AB (ref 0–40)
Albumin/Globulin Ratio: 2.4 — ABNORMAL HIGH (ref 1.2–2.2)
Albumin: 4.7 g/dL (ref 3.5–5.5)
Alkaline Phosphatase: 67 IU/L (ref 39–117)
BUN/Creatinine Ratio: 17 (ref 9–20)
BUN: 14 mg/dL (ref 6–24)
Bilirubin Total: 0.6 mg/dL (ref 0.0–1.2)
CHLORIDE: 104 mmol/L (ref 96–106)
CO2: 19 mmol/L — ABNORMAL LOW (ref 20–29)
CREATININE: 0.83 mg/dL (ref 0.76–1.27)
Calcium: 9.3 mg/dL (ref 8.7–10.2)
GFR calc Af Amer: 115 mL/min/{1.73_m2} (ref >59)
GFR calc non Af Amer: 100 mL/min/{1.73_m2} (ref >59)
GLUCOSE: 98 mg/dL (ref 65–99)
Globulin, Total: 2 g/dL (ref 1.5–4.5)
Potassium: 4.4 mmol/L (ref 3.5–5.2)
SODIUM: 142 mmol/L (ref 134–144)
Total Protein: 6.7 g/dL (ref 6.0–8.5)

## 2018-11-09 ENCOUNTER — Encounter: Payer: Self-pay | Admitting: Family Medicine

## 2019-11-17 ENCOUNTER — Telehealth: Payer: Self-pay | Admitting: Family Medicine

## 2019-11-17 ENCOUNTER — Other Ambulatory Visit: Payer: Self-pay

## 2019-11-17 ENCOUNTER — Encounter: Payer: Self-pay | Admitting: Family Medicine

## 2019-11-17 ENCOUNTER — Ambulatory Visit (INDEPENDENT_AMBULATORY_CARE_PROVIDER_SITE_OTHER): Payer: BC Managed Care – PPO | Admitting: Family Medicine

## 2019-11-17 DIAGNOSIS — J4520 Mild intermittent asthma, uncomplicated: Secondary | ICD-10-CM | POA: Diagnosis not present

## 2019-11-17 DIAGNOSIS — I10 Essential (primary) hypertension: Secondary | ICD-10-CM

## 2019-11-17 DIAGNOSIS — E785 Hyperlipidemia, unspecified: Secondary | ICD-10-CM

## 2019-11-17 DIAGNOSIS — Z23 Encounter for immunization: Secondary | ICD-10-CM

## 2019-11-17 MED ORDER — LISINOPRIL 10 MG PO TABS: 10.0000 mg | ORAL_TABLET | Freq: Every day | ORAL | 3 refills | Status: DC

## 2019-11-17 MED ORDER — ATORVASTATIN CALCIUM 10 MG PO TABS: 10.0000 mg | ORAL_TABLET | Freq: Every day | ORAL | 3 refills | Status: DC

## 2019-11-17 MED ORDER — ALBUTEROL SULFATE HFA 108 (90 BASE) MCG/ACT IN AERS: 1.0000 | INHALATION_SPRAY | Freq: Four times a day (QID) | RESPIRATORY_TRACT | 1 refills | Status: DC | PRN

## 2019-11-17 NOTE — Progress Notes (Signed)
Subjective:  Patient ID: JOSEPHMICHAEL ADONA, male    DOB: December 20, 1966  Age: 53 y.o. MRN: [id number]  CC: No chief complaint on file.   HPI Yordin Mais Karam presents for   Hypertension: Lisinopril 10 mg daily. Home readings:rare - no recent. No new side effects with meds.  BP Readings from Last 3 Encounters:  10/23/18 124/74  10/07/17 112/84  05/02/17 110/74   Lab Results  Component Value Date   CREATININE 0.83 10/23/2018   Hyperlipidemia: Lipitor 10 mg daily. No new side effects/myalgias.  Lab Results  Component Value Date   CHOL 191 10/23/2018   HDL 48 10/23/2018   LDLCALC 115 (H) 10/23/2018   TRIG 141 10/23/2018   CHOLHDL 4.0 10/23/2018   Lab Results  Component Value Date   ALT 46 (H) 10/23/2018   AST 45 (H) 10/23/2018   ALKPHOS 67 10/23/2018   BILITOT 0.6 10/23/2018    Asthma Mild intermittent, has used albuterol as needed in the past. Well controlled. Rare use albuterol - only needed once or twice.   Health maintenance: Colon cancer screening: Discussed multiple times previously, phone number again provided at his last physical in 2019.  Has not scheduled. Discussed Cologuard. Declines. He plans to schedule early in 2021.  Flu vaccine given today.   Had plastic surgery for nasal deviation. Septorhinoplasty with PDs platte and turbinate reduction.   History Patient Active Problem List   Diagnosis Date Noted  . Right shoulder pain 09/17/2016  . 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 HTN (hypertension) 10/29/2012  . Lipid disorder 10/29/2012   Past Medical History:  Diagnosis Date  . Allergy   . Asthma   . Hyperlipidemia   . Hypertension    No past surgical history on file. No Known Allergies Prior to Admission medications   Medication Sig Start Date End Date Taking? Authorizing Provider  albuterol (PROVENTIL HFA;VENTOLIN HFA) 108 (90 Base) MCG/ACT inhaler Inhale 1-2 puffs into the lungs every 6 (six) hours as  needed for wheezing. 10/23/18   Wendie Agreste, MD  atorvastatin (LIPITOR) 10 MG tablet Take 1 tablet (10 mg total) by mouth daily. 10/23/18   Wendie Agreste, MD  lisinopril (PRINIVIL,ZESTRIL) 10 MG tablet Take 1 tablet (10 mg total) by mouth daily. 10/23/18   Wendie Agreste, MD   Social History   Socioeconomic History  . Marital status: Single    Spouse name: Not on file  . Number of children: Not on file  . Years of education: Not on file  . Highest education level: Not on file  Occupational History  . Not on file  Social Needs  . Financial resource strain: Not on file  . Food insecurity    Worry: Not on file    Inability: Not on file  . Transportation needs    Medical: Not on file    Non-medical: Not on file  Tobacco Use  . Smoking status: Never Smoker  . Smokeless tobacco: Never Used  Substance and Sexual Activity  . Alcohol use: Yes    Alcohol/week: 1.0 standard drinks    Types: 1 Glasses of wine per week  . Drug use: No  . Sexual activity: Yes  Lifestyle  . Physical activity    Days per week: Not on file    Minutes per session: Not on file  . Stress: Not on file  Relationships  . Social connections    Talks on phone: Not on file  Gets together: Not on file    Attends religious service: Not on file    Active member of club or organization: Not on file    Attends meetings of clubs or organizations: Not on file    Relationship status: Not on file  . Intimate partner violence    Fear of current or ex partner: Not on file    Emotionally abused: Not on file    Physically abused: Not on file    Forced sexual activity: Not on file  Other Topics Concern  . Not on file  Social History Narrative  . Not on file    Review of Systems  Constitutional: Negative for fatigue and unexpected weight change.  Eyes: Negative for visual disturbance.  Respiratory: Negative for cough, chest tightness and shortness of breath.   Cardiovascular: Negative for chest pain,  palpitations and leg swelling.  Gastrointestinal: Negative for abdominal pain and blood in stool.  Neurological: Negative for dizziness, light-headedness and headaches.     Objective:  There were no vitals filed for this visit.   Physical Exam Vitals signs reviewed.  Constitutional:      Appearance: He is well-developed.  HENT:     Head: Normocephalic and atraumatic.  Eyes:     Pupils: Pupils are equal, round, and reactive to light.  Neck:     Vascular: No carotid bruit or JVD.  Cardiovascular:     Rate and Rhythm: Normal rate and regular rhythm.     Heart sounds: Normal heart sounds. No murmur.  Pulmonary:     Effort: Pulmonary effort is normal.     Breath sounds: Normal breath sounds. No rales.  Skin:    General: Skin is warm and dry.  Neurological:     Mental Status: He is alert and oriented to person, place, and time.     Assessment & Plan:  SHAWNMICHAEL ANDERS is a 53 y.o. male . Essential hypertension - Plan: Lipid panel, CMET with GFR  -  Stable, tolerating current regimen. Medications refilled. Labs pending as above.   Need for prophylactic vaccination and inoculation against influenza - Plan: Flu Vaccine  Hyperlipidemia, unspecified hyperlipidemia type - Plan: Lipid panel, CMET with GFR  -  Stable, tolerating current regimen. Medications refilled. Labs pending as above.   Mild intermittent asthma without complication  - stable, albuterol refilled if needed.   HM: Stressed importance of colon cancer screening. Declined cologuard, plans to schedule colonoscopy.   No orders of the defined types were placed in this encounter.  There are no Patient Instructions on file for this visit.    Signed, [name], MD [facility]

## 2019-11-17 NOTE — Patient Instructions (Addendum)
  No change in meds for now. Call [facility] to schedule colonoscopy when ready. Let me know if they need a referral.   Follow up for physical in about 6 months. Thanks for coming in today and take care.    If you have lab work done today you will be contacted with your lab results within the next 2 weeks.  If you have not heard from [place] then please contact us. The fastest way to get your results is to register for My Chart.   IF you received an x-ray today, you will receive an invoice from [facility]. Please contact [facility] at [phone] with questions or concerns regarding your invoice.   IF you received labwork today, you will receive an invoice from [place]. Please contact [facility] at [phone] with questions or concerns regarding your invoice.   Our billing staff will not be able to assist you with questions regarding bills from these companies.  You will be contacted with the lab results as soon as they are available. The fastest way to get your results is to activate your My Chart account. Instructions are located on the last page of this paperwork. If you have not heard from [place] regarding the results in 2 weeks, please contact this office.

## 2019-11-17 NOTE — Telephone Encounter (Addendum)
11/17/2019 - PATIENT CAME INTO THE OFFICE AND SAW DR. Carlota Raspberry ON Wednesday (11/17/2019) FOR HIS BLOOD PRESSURE AND  MEDICATION REFILLS. DR. Carlota Raspberry HAS REQUESTED HE RETURN IN 6 MONTHS (MAY 2021) TO HAVE HIS ANNUAL COMPLETE PHYSICAL. I DID NOT SEE HIM COME TO CHECK-OUT. I TRIED TO CALL AND SCHEDULE BUT HAD TO LEAVE HIM A VOICE MAIL TO RETURN MY CALL. [place]

## 2019-11-26 LAB — CMP14+EGFR

## 2019-11-26 LAB — LIPID PANEL

## 2020-10-09 ENCOUNTER — Telehealth: Payer: Self-pay | Admitting: Family Medicine

## 2020-10-09 NOTE — Telephone Encounter (Signed)
Please order fasting labs for CPE scheduled 02/18/20211

## 2020-11-01 ENCOUNTER — Ambulatory Visit (INDEPENDENT_AMBULATORY_CARE_PROVIDER_SITE_OTHER): Payer: 59 | Admitting: Family Medicine

## 2020-11-01 ENCOUNTER — Encounter: Payer: Self-pay | Admitting: Family Medicine

## 2020-11-01 ENCOUNTER — Other Ambulatory Visit: Payer: Self-pay

## 2020-11-01 VITALS — BP 130/78 | HR 61 | Temp 98.0°F | Ht 70.0 in | Wt 195.2 lb

## 2020-11-01 DIAGNOSIS — E785 Hyperlipidemia, unspecified: Secondary | ICD-10-CM | POA: Diagnosis not present

## 2020-11-01 DIAGNOSIS — I10 Essential (primary) hypertension: Secondary | ICD-10-CM | POA: Diagnosis not present

## 2020-11-01 DIAGNOSIS — Z23 Encounter for immunization: Secondary | ICD-10-CM | POA: Diagnosis not present

## 2020-11-01 DIAGNOSIS — J4520 Mild intermittent asthma, uncomplicated: Secondary | ICD-10-CM | POA: Diagnosis not present

## 2020-11-01 DIAGNOSIS — Z1211 Encounter for screening for malignant neoplasm of colon: Secondary | ICD-10-CM

## 2020-11-01 MED ORDER — ATORVASTATIN CALCIUM 10 MG PO TABS: 10.0000 mg | ORAL_TABLET | Freq: Every day | ORAL | 3 refills | Status: DC

## 2020-11-01 MED ORDER — ALBUTEROL SULFATE HFA 108 (90 BASE) MCG/ACT IN AERS: 1.0000 | INHALATION_SPRAY | Freq: Four times a day (QID) | RESPIRATORY_TRACT | 1 refills | Status: DC | PRN

## 2020-11-01 MED ORDER — LISINOPRIL 10 MG PO TABS: 10.0000 mg | ORAL_TABLET | Freq: Every day | ORAL | 3 refills | Status: DC

## 2020-11-01 NOTE — Patient Instructions (Addendum)
  Cologuard ordered.   No med changes. Follow up as planned for physical.    If you have lab work done today you will be contacted with your lab results within the next 2 weeks.  If you have not heard from [place] then please contact us. The fastest way to get your results is to register for My Chart.   IF you received an x-ray today, you will receive an invoice from [facility]. Please contact [facility] at [phone] with questions or concerns regarding your invoice.   IF you received labwork today, you will receive an invoice from [place]. Please contact [facility] at [phone] with questions or concerns regarding your invoice.   Our billing staff will not be able to assist you with questions regarding bills from these companies.  You will be contacted with the lab results as soon as they are available. The fastest way to get your results is to activate your My Chart account. Instructions are located on the last page of this paperwork. If you have not heard from [place] regarding the results in 2 weeks, please contact this office.

## 2020-11-01 NOTE — Progress Notes (Signed)
Subjective:  Patient ID: Curtis Berger, male    DOB: Nov 03, 1966  Age: 54 y.o. MRN: [id number]  CC:  Chief Complaint  Patient presents with  . Medical Management of Chronic Issues    med refills and flu shot     HPI Curtis Berger presents for   Hypertension: Lisinopril 10mg  qd.  No new side effect with meds.  Home readings: none.  Exercising at gym 5 days per week.  Has received covid vaccine - May 2021, pfizer.   Wt Readings from Last 3 Encounters:  11/01/20 195 lb 3.2 oz (88.5 kg)  10/23/18 197 lb (89.4 kg)  10/07/17 199 lb 6.4 oz (90.4 kg)    BP Readings from Last 3 Encounters:  11/01/20 130/78  10/23/18 124/74  10/07/17 112/84   Lab Results  Component Value Date   CREATININE CANCELED 11/17/2019   Hyperlipidemia: Lipitor 10 mg daily.  No new side effects.  Fasting  Today.  Lab Results  Component Value Date   CHOL CANCELED 11/17/2019   HDL CANCELED 11/17/2019   LDLCALC 115 (H) 10/23/2018   TRIG CANCELED 11/17/2019   CHOLHDL 4.0 10/23/2018   Lab Results  Component Value Date   ALT CANCELED 11/17/2019   AST CANCELED 11/17/2019   ALKPHOS CANCELED 11/17/2019   BILITOT CANCELED 11/17/2019   Asthma, mild intermittent Albuterol as needed. Using only seasonal., not daily in spring.   HM:  Colon: - no prior testing. No fh or personal or family hx of cancer, polyps, bleeding. Screening options with colonoscopy versus Cologuard discussed. Discussed timing of repeat testing intervals if normal, as well as potential need for diagnostic Colonoscopy if positive Cologuard. Understanding expressed, and chose Cologuard.    History Patient Active Problem List   Diagnosis Date Noted  . Right shoulder pain 09/17/2016  . 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 HTN (hypertension) 10/29/2012  . Lipid disorder 10/29/2012   Past Medical History:  Diagnosis Date  . Allergy   . Asthma   . Hyperlipidemia   . Hypertension    No  past surgical history on file. No Known Allergies Prior to Admission medications   Medication Sig Start Date End Date Taking? Authorizing Provider  albuterol (VENTOLIN HFA) 108 (90 Base) MCG/ACT inhaler Inhale 1-2 puffs into the lungs every 6 (six) hours as needed for wheezing. 11/17/19  Yes Wendie Agreste, MD  atorvastatin (LIPITOR) 10 MG tablet Take 1 tablet (10 mg total) by mouth daily. 11/17/19  Yes Wendie Agreste, MD  lisinopril (ZESTRIL) 10 MG tablet Take 1 tablet (10 mg total) by mouth daily. 11/17/19  Yes Wendie Agreste, MD   Social History   Socioeconomic History  . Marital status: Single    Spouse name: Not on file  . Number of children: Not on file  . Years of education: Not on file  . Highest education level: Not on file  Occupational History  . Not on file  Tobacco Use  . Smoking status: Never Smoker  . Smokeless tobacco: Never Used  Substance and Sexual Activity  . Alcohol use: Yes    Alcohol/week: 1.0 standard drink    Types: 1 Glasses of wine per week  . Drug use: No  . Sexual activity: Yes  Other Topics Concern  . Not on file  Social History Narrative  . Not on file   Social Determinants of Health   Financial Resource Strain:   . Difficulty of Paying Living Expenses: Not on  file  Food Insecurity:   . Worried About Charity fundraiser in the Last Year: Not on file  . Ran Out of Food in the Last Year: Not on file  Transportation Needs:   . Lack of Transportation (Medical): Not on file  . Lack of Transportation (Non-Medical): Not on file  Physical Activity:   . Days of Exercise per Week: Not on file  . Minutes of Exercise per Session: Not on file  Stress:   . Feeling of Stress : Not on file  Social Connections:   . Frequency of Communication with Friends and Family: Not on file  . Frequency of Social Gatherings with Friends and Family: Not on file  . Attends Religious Services: Not on file  . Active Member of Clubs or Organizations: Not on  file  . Attends Archivist Meetings: Not on file  . Marital Status: Not on file  Intimate Partner Violence:   . Fear of Current or Ex-Partner: Not on file  . Emotionally Abused: Not on file  . Physically Abused: Not on file  . Sexually Abused: Not on file    Review of Systems  Constitutional: Negative for fatigue and unexpected weight change.  Eyes: Negative for visual disturbance.  Respiratory: Negative for cough, chest tightness and shortness of breath.   Cardiovascular: Negative for chest pain, palpitations and leg swelling.  Gastrointestinal: Negative for abdominal pain and blood in stool.  Neurological: Negative for dizziness, light-headedness and headaches.     Objective:   Vitals:   11/01/20 1427  BP: 130/78  Pulse: 61  Temp: 98 F (36.7 C)  TempSrc: Temporal  SpO2: 99%  Weight: 195 lb 3.2 oz (88.5 kg)  Height: 5\' 10"  (1.778 m)     Physical Exam Vitals reviewed.  Constitutional:      Appearance: He is well-developed.  HENT:     Head: Normocephalic and atraumatic.  Eyes:     Pupils: Pupils are equal, round, and reactive to light.  Neck:     Vascular: No carotid bruit or JVD.  Cardiovascular:     Rate and Rhythm: Normal rate and regular rhythm.     Heart sounds: Normal heart sounds. No murmur heard.   Pulmonary:     Effort: Pulmonary effort is normal.     Breath sounds: Normal breath sounds. No rales.  Skin:    General: Skin is warm and dry.  Neurological:     Mental Status: He is alert and oriented to person, place, and time.        Assessment & Plan:  Curtis Berger is a 54 y.o. male . Essential hypertension - Plan: lisinopril (ZESTRIL) 10 MG tablet, Comprehensive metabolic panel  -  Stable, tolerating current regimen. Medications refilled. Labs pending as above.   Hyperlipidemia, unspecified hyperlipidemia type - Plan: atorvastatin (LIPITOR) 10 MG tablet, Lipid panel  -  Stable, tolerating current regimen. Medications  refilled. Labs pending as above.   Mild intermittent asthma without complication - Plan: albuterol (VENTOLIN HFA) 108 (90 Base) MCG/ACT inhaler  - controlled with rare use albuterol. Continue same.   Need for immunization against influenza - Plan: Flu Vaccine QUAD 36+ mos IM  Special screening for malignant neoplasms, colon - Plan: Cologuard   Meds ordered this encounter  Medications  . atorvastatin (LIPITOR) 10 MG tablet    Sig: Take 1 tablet (10 mg total) by mouth daily.    Dispense:  90 tablet    Refill:  3  .  lisinopril (ZESTRIL) 10 MG tablet    Sig: Take 1 tablet (10 mg total) by mouth daily.    Dispense:  90 tablet    Refill:  3  . albuterol (VENTOLIN HFA) 108 (90 Base) MCG/ACT inhaler    Sig: Inhale 1-2 puffs into the lungs every 6 (six) hours as needed for wheezing.    Dispense:  18 g    Refill:  1   Patient Instructions    Cologuard ordered.   No med changes. Follow up as planned for physical.    If you have lab work done today you will be contacted with your lab results within the next 2 weeks.  If you have not heard from [place] then please contact us. The fastest way to get your results is to register for My Chart.   IF you received an x-ray today, you will receive an invoice from [facility]. Please contact [facility] at [phone] with questions or concerns regarding your invoice.   IF you received labwork today, you will receive an invoice from [facility]. Please contact [facility] at [phone] with questions or concerns regarding your invoice.   Our billing staff will not be able to assist you with questions regarding bills from these companies.  You will be contacted with the lab results as soon as they are available. The fastest way to get your results is to activate your My Chart account. Instructions are located on the last page of this paperwork. If you have not heard from [place] regarding the results in 2 weeks, please contact this office.            Signed, [name], MD [facility]

## 2020-11-02 LAB — COMPREHENSIVE METABOLIC PANEL
ALT: 36 IU/L (ref 0–44)
AST: 27 IU/L (ref 0–40)
Albumin/Globulin Ratio: 1.7 (ref 1.2–2.2)
Albumin: 4.4 g/dL (ref 3.8–4.9)
Alkaline Phosphatase: 75 IU/L (ref 44–121)
BUN/Creatinine Ratio: 14 (ref 9–20)
BUN: 13 mg/dL (ref 6–24)
Bilirubin Total: 0.7 mg/dL (ref 0.0–1.2)
CO2: 25 mmol/L (ref 20–29)
Calcium: 9.4 mg/dL (ref 8.7–10.2)
Chloride: 102 mmol/L (ref 96–106)
Creatinine, Ser: 0.91 mg/dL (ref 0.76–1.27)
GFR calc Af Amer: 109 mL/min/{1.73_m2} (ref >59)
GFR calc non Af Amer: 94 mL/min/{1.73_m2} (ref >59)
Globulin, Total: 2.6 g/dL (ref 1.5–4.5)
Glucose: 95 mg/dL (ref 65–99)
Potassium: 4.8 mmol/L (ref 3.5–5.2)
Sodium: 139 mmol/L (ref 134–144)
Total Protein: 7 g/dL (ref 6.0–8.5)

## 2020-11-02 LAB — LIPID PANEL
Chol/HDL Ratio: 3.5 ratio (ref 0.0–5.0)
Cholesterol, Total: 208 mg/dL — ABNORMAL HIGH (ref 100–199)
HDL: 59 mg/dL (ref >39)
LDL Chol Calc (NIH): 125 mg/dL — ABNORMAL HIGH (ref 0–99)
Triglycerides: 135 mg/dL (ref 0–149)
VLDL Cholesterol Cal: 24 mg/dL (ref 5–40)

## 2020-11-08 ENCOUNTER — Encounter: Payer: Self-pay | Admitting: Radiology

## 2021-02-12 ENCOUNTER — Ambulatory Visit (INDEPENDENT_AMBULATORY_CARE_PROVIDER_SITE_OTHER): Payer: 59 | Admitting: Family Medicine

## 2021-02-12 ENCOUNTER — Other Ambulatory Visit: Payer: Self-pay

## 2021-02-12 DIAGNOSIS — R7989 Other specified abnormal findings of blood chemistry: Secondary | ICD-10-CM

## 2021-02-12 DIAGNOSIS — E785 Hyperlipidemia, unspecified: Secondary | ICD-10-CM

## 2021-02-13 LAB — LIPID PANEL
Chol/HDL Ratio: 3.4 ratio (ref 0.0–5.0)
Cholesterol, Total: 186 mg/dL (ref 100–199)
HDL: 55 mg/dL (ref >39)
LDL Chol Calc (NIH): 104 mg/dL — ABNORMAL HIGH (ref 0–99)
Triglycerides: 158 mg/dL — ABNORMAL HIGH (ref 0–149)
VLDL Cholesterol Cal: 27 mg/dL (ref 5–40)

## 2021-02-16 ENCOUNTER — Encounter: Payer: Self-pay | Admitting: Family Medicine

## 2021-02-16 ENCOUNTER — Ambulatory Visit (INDEPENDENT_AMBULATORY_CARE_PROVIDER_SITE_OTHER): Payer: 59 | Admitting: Family Medicine

## 2021-02-16 ENCOUNTER — Other Ambulatory Visit: Payer: Self-pay

## 2021-02-16 VITALS — BP 122/75 | HR 82 | Temp 98.1°F | Ht 70.0 in | Wt 180.0 lb

## 2021-02-16 DIAGNOSIS — Z125 Encounter for screening for malignant neoplasm of prostate: Secondary | ICD-10-CM

## 2021-02-16 DIAGNOSIS — J4520 Mild intermittent asthma, uncomplicated: Secondary | ICD-10-CM

## 2021-02-16 DIAGNOSIS — M25512 Pain in left shoulder: Secondary | ICD-10-CM | POA: Diagnosis not present

## 2021-02-16 DIAGNOSIS — I10 Essential (primary) hypertension: Secondary | ICD-10-CM | POA: Diagnosis not present

## 2021-02-16 DIAGNOSIS — Z0001 Encounter for general adult medical examination with abnormal findings: Secondary | ICD-10-CM

## 2021-02-16 DIAGNOSIS — Z0000 Encounter for general adult medical examination without abnormal findings: Secondary | ICD-10-CM

## 2021-02-16 DIAGNOSIS — E785 Hyperlipidemia, unspecified: Secondary | ICD-10-CM | POA: Diagnosis not present

## 2021-02-16 NOTE — Progress Notes (Signed)
Subjective:  Patient ID: Curtis Berger, male    DOB: 1966-02-25  Age: 55 y.o. MRN: [id number]  CC:  Chief Complaint  Patient presents with  . Annual Exam    PT reports he is feeling well, but would like the provider to look at his L shoulder.  PT reports some tenderness and soreness in the L shoulder. Pt states he had surgery on his R shoulder when he was younger.    HPI Curtis Berger presents for  Annual physical exam and left shoulder issues  Hypertension: Lisinopril 10 mg daily.  No new side effects.  Home readings: none.  BP Readings from Last 3 Encounters:  02/16/21 122/75  11/01/20 130/78  10/23/18 124/74   Lab Results  Component Value Date   CREATININE 0.91 11/01/2020   Hyperlipidemia: Lipitor 10 mg daily. No new myalgias/side effects. Some dietary indiscretion around the holidays, but still exercising frequently.  Lab Results  Component Value Date   CHOL 186 02/12/2021   HDL 55 02/12/2021   LDLCALC 104 (H) 02/12/2021   TRIG 158 (H) 02/12/2021   CHOLHDL 3.4 02/12/2021   Lab Results  Component Value Date   ALT 36 11/01/2020   AST 27 11/01/2020   ALKPHOS 75 11/01/2020   BILITOT 0.7 11/01/2020   Asthma, mild intermittent Albuterol as needed, typically only in springtime/seasonal. Stable.  Left shoulder pain: Past few weeks. No specific injury. Exercises and weightlifting.  Behind shoulder. No neck pain.  Sore at end of range of motion, limited with crossover to over shoulder.  Tx: advil  - daily, some less soreness. Some shoulder exercises from sports medicine Has cut back on shoulder exercises temporarily past few weeks. Sore with planks- has cut back.   Rotator cuff tear and adhesive capsulitis R shoulder in 2017. Dr. Tamera Punt.   Cancer screening Cologuard ordered last year.  Plans to send in.  Lab Results  Component Value Date   PSA1 1.2 10/23/2018   PSA1 1.3 10/07/2017   PSA 1.00 03/16/2016   PSA 0.98 11/08/2013   PSA 1.15  10/29/2012  the natural history of prostate cancer and ongoing controversy regarding screening and potential treatment outcomes of prostate cancer has been discussed with the patient. The meaning of a false positive PSA and a false negative PSA has been discussed. He indicates understanding of the limitations of this screening test and wishes to proceed with screening PSA testing. Declines DRE. No FH of prostate CA.   Immunization History  Administered Date(s) Administered  . Influenza Split 10/29/2012, 10/17/2014  . Influenza,inj,Quad PF,6+ Mos 11/08/2013, 10/07/2017, 10/23/2018, 11/17/2019, 11/01/2020  . Influenza-Unspecified 10/30/2014  . Tdap 12/31/2007, 10/23/2018  shingles vaccine: agrees to receive.  covid vaccine: pfizer x2, no booster - does not plan to get.    Depression screen [facility] 2/9 02/16/2021 02/16/2021 11/01/2020 11/17/2019 10/23/2018  Decreased Interest 0 0 0 0 0  Down, Depressed, Hopeless 0 0 0 0 0  PHQ - 2 Score 0 0 0 0 0    Hearing Screening   125Hz  250Hz  500Hz  1000Hz  2000Hz  3000Hz  4000Hz  6000Hz  8000Hz   Right ear:           Left ear:             Visual Acuity Screening   Right eye Left eye Both eyes  Without correction: 20/20 20/25 20/20   With correction:     last optho visit in November.   Dental: every 72months.   Exercise: 1hr class 5 days per week,  tennis, walking on weekends. Exercises daily. Over 300 min's/week.   Alcohol - social, 1-2 per week. No tobacco.   History Patient Active Problem List   Diagnosis Date Noted  . Right shoulder pain 09/17/2016  . 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 HTN (hypertension) 10/29/2012  . Lipid disorder 10/29/2012   Past Medical History:  Diagnosis Date  . Allergy   . Asthma   . Hyperlipidemia   . Hypertension    History reviewed. No pertinent surgical history. No Known Allergies Prior to Admission medications   Medication Sig Start Date End Date Taking? Authorizing Provider   albuterol (VENTOLIN HFA) 108 (90 Base) MCG/ACT inhaler Inhale 1-2 puffs into the lungs every 6 (six) hours as needed for wheezing. 11/01/20  Yes Wendie Agreste, MD  atorvastatin (LIPITOR) 10 MG tablet Take 1 tablet (10 mg total) by mouth daily. 11/01/20  Yes Wendie Agreste, MD  lisinopril (ZESTRIL) 10 MG tablet Take 1 tablet (10 mg total) by mouth daily. 11/01/20  Yes Wendie Agreste, MD   Social History   Socioeconomic History  . Marital status: Single    Spouse name: Not on file  . Number of children: Not on file  . Years of education: Not on file  . Highest education level: Not on file  Occupational History  . Not on file  Tobacco Use  . Smoking status: Never Smoker  . Smokeless tobacco: Never Used  Vaping Use  . Vaping Use: Never used  Substance and Sexual Activity  . Alcohol use: Yes    Alcohol/week: 1.0 standard drink    Types: 1 Glasses of wine per week  . Drug use: No  . Sexual activity: Yes  Other Topics Concern  . Not on file  Social History Narrative  . Not on file   Social Determinants of Health   Financial Resource Strain: Not on file  Food Insecurity: Not on file  Transportation Needs: Not on file  Physical Activity: Not on file  Stress: Not on file  Social Connections: Not on file  Intimate Partner Violence: Not on file    Review of Systems  Constitutional: Negative for fatigue and unexpected weight change.  Eyes: Negative for visual disturbance.  Respiratory: Negative for cough, chest tightness and shortness of breath.   Cardiovascular: Negative for chest pain, palpitations and leg swelling.  Gastrointestinal: Negative for abdominal pain and blood in stool.  Neurological: Negative for dizziness, light-headedness and headaches.  left shoulder pain as above.   Objective:   Vitals:   02/16/21 0825  BP: 122/75  Pulse: 82  Temp: 98.1 F (36.7 C)  TempSrc: Temporal  SpO2: 98%  Weight: 180 lb (81.6 kg)  Height: 5\' 10"  (1.778 m)      Physical Exam Vitals reviewed.  Constitutional:      Appearance: He is well-developed and well-nourished.  HENT:     Head: Normocephalic and atraumatic.  Eyes:     Extraocular Movements: EOM normal.     Pupils: Pupils are equal, round, and reactive to light.  Neck:     Vascular: No carotid bruit or JVD.  Cardiovascular:     Rate and Rhythm: Normal rate and regular rhythm.     Heart sounds: Normal heart sounds. No murmur heard.   Pulmonary:     Effort: Pulmonary effort is normal.     Breath sounds: Normal breath sounds. No rales.  Musculoskeletal:        General: No edema.  Comments: C-spine pain-free range of motion, no midline bony tenderness, does not reproduce left shoulder symptoms.  Left shoulder no focal bony tenderness, [place], clavicle and AC nontender.  Deltoid nontender.  Full range of motion.  Slight discomfort with crossover, but more posterior shoulder, not at [facility].  Minimal discomfort with [place], negative Neer.  Minimal discomfort with empty can but full strength, equal to right.  Other rotator cuff testing full strength, no pain.  Negative Speed, [place].  Skin:    General: Skin is warm and dry.  Neurological:     Mental Status: He is alert and oriented to person, place, and time.  Psychiatric:        Mood and Affect: Mood and affect normal.     Assessment & Plan:  MARQUAVIUS SCAIFE is a 55 y.o. male . Annual physical exam  - -anticipatory guidance as below in AVS, screening labs above. Health maintenance items as above in HPI discussed/recommended as applicable.   -Plans to send in Cologuard Shingles vaccination discussed, Shingrix at pharmacy or next visit.  Essential hypertension - Plan: Comprehensive metabolic panel  -Stable, continue same regimen  Acute pain of left shoulder - Plan: Ambulatory referral to Orthopedic Surgery  -Possible component of rotator cuff tendinosis, mild subacromial bursitis/impingement.  No apparent weakness.  Continued  avoidance of offending activities/activity modification, stretches/range of motion.  Intermittent NSAID temporarily okay for now.  Plan for follow-up with orthopedist, referred to prior provider.  Deferred injection at this time.  Hyperlipidemia, unspecified hyperlipidemia type - Plan: Comprehensive metabolic panel  -Tolerating current regimen, check labs  Mild intermittent asthma without complication  -Rare symptoms, controlled with albuterol  Screening for prostate cancer - Plan: PSA  -As above check PSA.  Also recommended sending in Cologuard colon cancer screening.  No orders of the defined types were placed in this encounter.  Patient Instructions   Send in Cologuard for colon cancer screening.  I will add prostate test as well as electrolytes to your recent blood work. Shoulder pain could be related to overuse or rotator cuff syndrome.  Okay to continue intermittent Advil, avoid offending activities, avoid heavy or repetitive weightlifting.  Continue range of motion/gentle stretches.  I will refer you to Dr. Tamera Punt.  Thanks for coming in today. Let me know if there are questions.    Keeping you healthy  Get these tests  Blood pressure- Have your blood pressure checked once a year by your healthcare provider.  Normal blood pressure is 120/80  Weight- Have your body mass index (BMI) calculated to screen for obesity.  BMI is a measure of body fat based on height and weight. You can also calculate your own BMI at ViewBanking.si.  Cholesterol- Have your cholesterol checked every year.  Diabetes- Have your blood sugar checked regularly if you have high blood pressure, high cholesterol, have a family history of diabetes or if you are overweight.  Screening for Colon Cancer- Colonoscopy starting at age 81.  Screening may begin sooner depending on your family history and other health conditions. Follow up colonoscopy as directed by your Gastroenterologist.  Screening for  Prostate Cancer- Both blood work (PSA) and a rectal exam help screen for Prostate Cancer.  Screening begins at age 38 with African-American men and at age 49 with Caucasian men.  Screening may begin sooner depending on your family history.  Take these medicines  Aspirin- One aspirin daily can help prevent Heart disease and Stroke.  Flu shot- Every fall.  Tetanus- Every 10  years.  shingrix vaccine when you are ready - can be done at pharmacy.  'Take these steps  Don't smoke- If you do smoke, talk to your doctor about quitting.  For tips on how to quit, go to www.smokefree.gov or call 1-800-QUIT-NOW.  Be physically active- Exercise 5 days a week for at least 30 minutes.  If you are not already physically active start slow and gradually work up to 30 minutes of moderate physical activity.  Examples of moderate activity include walking briskly, mowing the yard, dancing, swimming, bicycling, etc.  Eat a healthy diet- Eat a variety of healthy food such as fruits, vegetables, low fat milk, low fat cheese, yogurt, lean meant, poultry, fish, beans, tofu, etc. For more information go to www.thenutritionsource.org  Drink alcohol in moderation- Limit alcohol intake to less than two drinks a day. Never drink and drive.  Dentist- Brush and floss twice daily; visit your dentist twice a year.  Depression- Your emotional health is as important as your physical health. If you're feeling down, or losing interest in things you would normally enjoy please talk to your healthcare provider.  Eye exam- Visit your eye doctor every year.  Safe sex- If you may be exposed to a sexually transmitted infection, use a condom.  Seat belts- Seat belts can save your life; always wear one.  Smoke/Carbon Monoxide detectors- These detectors need to be installed on the appropriate level of your home.  Replace batteries at least once a year.  Skin cancer- When out in the sun, cover up and use sunscreen 15 SPF or  higher.  Violence- If anyone is threatening you, please tell your healthcare provider.  Living Will/ Health care power of attorney- Speak with your healthcare provider and family. Shoulder Pain Many things can cause shoulder pain, including:  An injury to the shoulder.  Overuse of the shoulder.  Arthritis. The source of the pain can be:  Inflammation.  An injury to the shoulder joint.  An injury to a tendon, ligament, or bone. Follow these instructions at home: Pay attention to changes in your symptoms. Let your health care provider know about them. Follow these instructions to relieve your pain. If you have a sling:  Wear the sling as told by your health care provider. Remove it only as told by your health care provider.  Loosen the sling if your fingers tingle, become numb, or turn cold and blue.  Keep the sling clean.  If the sling is not waterproof: ? Do not let it get wet. Remove it to shower or bathe.  Move your arm as little as possible, but keep your hand moving to prevent swelling. Managing pain, stiffness, and swelling  If directed, put ice on the painful area: ? Put ice in a plastic bag. ? Place a towel between your skin and the bag. ? Leave the ice on for 20 minutes, 2-3 times per day. Stop applying ice if it does not help with the pain.  Squeeze a soft ball or a foam pad as much as possible. This helps to keep the shoulder from swelling. It also helps to strengthen the arm.   General instructions  Take over-the-counter and prescription medicines only as told by your health care provider.  Keep all follow-up visits as told by your health care provider. This is important. Contact a health care provider if:  Your pain gets worse.  Your pain is not relieved with medicines.  New pain develops in your arm, hand, or fingers. Get  help right away if:  Your arm, hand, or fingers: ? Tingle. ? Become numb. ? Become swollen. ? Become painful. ? Turn white  or blue. Summary  Shoulder pain can be caused by an injury, overuse, or arthritis.  Pay attention to changes in your symptoms. Let your health care provider know about them.  This condition may be treated with a sling, ice, and pain medicines.  Contact your health care provider if the pain gets worse or new pain develops. Get help right away if your arm, hand, or fingers tingle or become numb, swollen, or painful.  Keep all follow-up visits as told by your health care provider. This is important. This information is not intended to replace advice given to you by your health care provider. Make sure you discuss any questions you have with your health care provider. Document Revised: 06/30/2018 Document Reviewed: 06/30/2018 Elsevier Patient Education  2021 [employer].      If you have lab work done today you will be contacted with your lab results within the next 2 weeks.  If you have not heard from [place] then please contact us. The fastest way to get your results is to register for My Chart.   IF you received an x-ray today, you will receive an invoice from [facility]. Please contact [facility] at [phone] with questions or concerns regarding your invoice.   IF you received labwork today, you will receive an invoice from [facility]. Please contact [facility] at [phone] with questions or concerns regarding your invoice.   Our billing staff will not be able to assist you with questions regarding bills from these companies.  You will be contacted with the lab results as soon as they are available. The fastest way to get your results is to activate your My Chart account. Instructions are located on the last page of this paperwork. If you have not heard from [place] regarding the results in 2 weeks, please contact this office.         Signed, [name], MD [facility]

## 2021-02-16 NOTE — Patient Instructions (Addendum)
Send in Cologuard for colon cancer screening.  I will add prostate test as well as electrolytes to your recent blood work. Shoulder pain could be related to overuse or rotator cuff syndrome.  Okay to continue intermittent Advil, avoid offending activities, avoid heavy or repetitive weightlifting.  Continue range of motion/gentle stretches.  I will refer you to Dr. Tamera Punt.  Thanks for coming in today. Let me know if there are questions.    Keeping you healthy  Get these tests  Blood pressure- Have your blood pressure checked once a year by your healthcare provider.  Normal blood pressure is 120/80  Weight- Have your body mass index (BMI) calculated to screen for obesity.  BMI is a measure of body fat based on height and weight. You can also calculate your own BMI at ViewBanking.[email or web address].  Cholesterol- Have your cholesterol checked every year.  Diabetes- Have your blood sugar checked regularly if you have high blood pressure, high cholesterol, have a family history of diabetes or if you are overweight.  Screening for Colon Cancer- Colonoscopy starting at age 50.  Screening may begin sooner depending on your family history and other health conditions. Follow up colonoscopy as directed by your Gastroenterologist.  Screening for Prostate Cancer- Both blood work (PSA) and a rectal exam help screen for Prostate Cancer.  Screening begins at age 70 with African-American men and at age 74 with Caucasian men.  Screening may begin sooner depending on your family history.  Take these medicines  Aspirin- One aspirin daily can help prevent Heart disease and Stroke.  Flu shot- Every fall.  Tetanus- Every 10 years.  shingrix vaccine when you are ready - can be done at pharmacy.  'Take these steps  Don't smoke- If you do smoke, talk to your doctor about quitting.  For tips on how to quit, go to www.smokefree.gov or call 1-800-QUIT-NOW.  Be physically active- Exercise 5 days a week for at least  30 minutes.  If you are not already physically active start slow and gradually work up to 30 minutes of moderate physical activity.  Examples of moderate activity include walking briskly, mowing the yard, dancing, swimming, bicycling, etc.  Eat a healthy diet- Eat a variety of healthy food such as fruits, vegetables, low fat milk, low fat cheese, yogurt, lean meant, poultry, fish, beans, tofu, etc. For more information go to www.thenutritionsource.org  Drink alcohol in moderation- Limit alcohol intake to less than two drinks a day. Never drink and drive.  Dentist- Brush and floss twice daily; visit your dentist twice a year.  Depression- Your emotional health is as important as your physical health. If you're feeling down, or losing interest in things you would normally enjoy please talk to your healthcare provider.  Eye exam- Visit your eye doctor every year.  Safe sex- If you may be exposed to a sexually transmitted infection, use a condom.  Seat belts- Seat belts can save your life; always wear one.  Smoke/Carbon Monoxide detectors- These detectors need to be installed on the appropriate level of your home.  Replace batteries at least once a year.  Skin cancer- When out in the sun, cover up and use sunscreen 15 SPF or higher.  Violence- If anyone is threatening you, please tell your healthcare provider.  Living Will/ Health care power of attorney- Speak with your healthcare provider and family. Shoulder Pain Many things can cause shoulder pain, including:  An injury to the shoulder.  Overuse of the shoulder.  Arthritis. The source of  the pain can be:  Inflammation.  An injury to the shoulder joint.  An injury to a tendon, ligament, or bone. Follow these instructions at home: Pay attention to changes in your symptoms. Let your health care provider know about them. Follow these instructions to relieve your pain. If you have a sling:  Wear the sling as told by your health  care provider. Remove it only as told by your health care provider.  Loosen the sling if your fingers tingle, become numb, or turn cold and blue.  Keep the sling clean.  If the sling is not waterproof: ? Do not let it get wet. Remove it to shower or bathe.  Move your arm as little as possible, but keep your hand moving to prevent swelling. Managing pain, stiffness, and swelling  If directed, put ice on the painful area: ? Put ice in a plastic bag. ? Place a towel between your skin and the bag. ? Leave the ice on for 20 minutes, 2-3 times per day. Stop applying ice if it does not help with the pain.  Squeeze a soft ball or a foam pad as much as possible. This helps to keep the shoulder from swelling. It also helps to strengthen the arm.   General instructions  Take over-the-counter and prescription medicines only as told by your health care provider.  Keep all follow-up visits as told by your health care provider. This is important. Contact a health care provider if:  Your pain gets worse.  Your pain is not relieved with medicines.  New pain develops in your arm, hand, or fingers. Get help right away if:  Your arm, hand, or fingers: ? Tingle. ? Become numb. ? Become swollen. ? Become painful. ? Turn white or blue. Summary  Shoulder pain can be caused by an injury, overuse, or arthritis.  Pay attention to changes in your symptoms. Let your health care provider know about them.  This condition may be treated with a sling, ice, and pain medicines.  Contact your health care provider if the pain gets worse or new pain develops. Get help right away if your arm, hand, or fingers tingle or become numb, swollen, or painful.  Keep all follow-up visits as told by your health care provider. This is important. This information is not intended to replace advice given to you by your health care provider. Make sure you discuss any questions you have with your health care  provider. Document Revised: 06/30/2018 Document Reviewed: 06/30/2018 Elsevier Patient Education  2021 [employer].      If you have lab work done today you will be contacted with your lab results within the next 2 weeks.  If you have not heard from [place] then please contact us. The fastest way to get your results is to register for My Chart.   IF you received an x-ray today, you will receive an invoice from [facility]. Please contact [facility] at [phone] with questions or concerns regarding your invoice.   IF you received labwork today, you will receive an invoice from [facility]. Please contact [facility] at [phone] with questions or concerns regarding your invoice.   Our billing staff will not be able to assist you with questions regarding bills from these companies.  You will be contacted with the lab results as soon as they are available. The fastest way to get your results is to activate your My Chart account. Instructions are located on the last page of this paperwork. If you  have not heard from [place] regarding the results in 2 weeks, please contact this office.

## 2021-02-21 LAB — COMPREHENSIVE METABOLIC PANEL
ALT: 30 IU/L (ref 0–44)
AST: 28 IU/L (ref 0–40)
Albumin/Globulin Ratio: 2.2 (ref 1.2–2.2)
Albumin: 4.4 g/dL (ref 3.8–4.9)
Alkaline Phosphatase: 66 IU/L (ref 44–121)
BUN/Creatinine Ratio: 15 (ref 9–20)
BUN: 14 mg/dL (ref 6–24)
Bilirubin Total: 0.2 mg/dL (ref 0.0–1.2)
CO2: 20 mmol/L (ref 20–29)
Calcium: 9.3 mg/dL (ref 8.7–10.2)
Chloride: 98 mmol/L (ref 96–106)
Creatinine, Ser: 0.94 mg/dL (ref 0.76–1.27)
GFR calc Af Amer: 104 mL/min/{1.73_m2} (ref >59)
GFR calc non Af Amer: 90 mL/min/{1.73_m2} (ref >59)
Globulin, Total: 2 g/dL (ref 1.5–4.5)
Glucose: 95 mg/dL (ref 65–99)
Potassium: 4.4 mmol/L (ref 3.5–5.2)
Sodium: 135 mmol/L (ref 134–144)
Total Protein: 6.4 g/dL (ref 6.0–8.5)

## 2021-02-21 LAB — PSA: Prostate Specific Ag, Serum: 2.5 ng/mL (ref 0.0–4.0)

## 2021-02-21 LAB — SPECIMEN STATUS REPORT

## 2021-08-16 ENCOUNTER — Ambulatory Visit: Payer: Self-pay | Admitting: Family Medicine

## 2021-09-17 ENCOUNTER — Other Ambulatory Visit: Payer: Self-pay

## 2021-09-17 ENCOUNTER — Ambulatory Visit (INDEPENDENT_AMBULATORY_CARE_PROVIDER_SITE_OTHER): Payer: 59 | Admitting: Family Medicine

## 2021-09-17 VITALS — BP 128/74 | HR 73 | Temp 98.0°F | Resp 16 | Ht 70.0 in | Wt 190.6 lb

## 2021-09-17 DIAGNOSIS — Z23 Encounter for immunization: Secondary | ICD-10-CM

## 2021-09-17 DIAGNOSIS — I10 Essential (primary) hypertension: Secondary | ICD-10-CM

## 2021-09-17 DIAGNOSIS — E785 Hyperlipidemia, unspecified: Secondary | ICD-10-CM | POA: Diagnosis not present

## 2021-09-17 MED ORDER — ATORVASTATIN CALCIUM 10 MG PO TABS: 10.0000 mg | ORAL_TABLET | Freq: Every day | ORAL | 3 refills | Status: DC

## 2021-09-17 MED ORDER — LISINOPRIL 10 MG PO TABS: 10.0000 mg | ORAL_TABLET | Freq: Every day | ORAL | 3 refills | Status: DC

## 2021-09-17 NOTE — Patient Instructions (Signed)
No med changes today. Recheck for physical in 6 months. Let me know of there are questions sooner.

## 2021-09-17 NOTE — Progress Notes (Signed)
Subjective:  Patient ID: Curtis Berger, male    DOB: [birth year]  Age: 55 y.o. MRN: [id number]  CC:  Chief Complaint  Patient presents with   Hyperlipidemia    Pt due for las and refill    Hypertension    Pt due for refill and recheck, denies physical sxs,     HPI Curtis Berger presents for    Hyperlipidemia: Lipitor '10mg'$  qd. No new myalgias/side effects no missed doses.  Not fasting - ate 5 hrs ago.  Lab Results  Component Value Date   CHOL 186 02/12/2021   HDL 55 02/12/2021   LDLCALC 104 (H) 02/12/2021   TRIG 158 (H) 02/12/2021   CHOLHDL 3.4 02/12/2021   Lab Results  Component Value Date   ALT 30 02/12/2021   AST 28 02/12/2021   ALKPHOS 66 02/12/2021   BILITOT 0.2 02/12/2021    Hypertension: Lisinopril '10mg'$  QD.  Home readings: none.  No new side effects. No cough.  Exercising at gym, 5 days per week. No dyspnea/CP with exertion.  BP Readings from Last 3 Encounters:  09/17/21 128/74  02/16/21 122/75  11/01/20 130/78   Lab Results  Component Value Date   CREATININE 0.94 02/12/2021   Immunization History  Administered Date(s) Administered   Influenza Split 10/29/2012, 10/17/2014   Influenza,inj,Quad PF,6+ Mos 11/08/2013, 10/07/2017, 10/23/2018, 11/17/2019, 11/01/2020   Influenza-Unspecified 10/30/2014   Tdap 12/31/2007, 10/23/2018  Flu vaccine today.  Covid vaccine - pfizer x 2, no booster. No planning on getting.  Declines shingles vaccine at this time.   Colon cancer screening - has Cologuard, has not sent in yet.   In PT for left shoulder surgery -Dr. Tamera Punt. Doing well.    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 No  Known Allergies Prior to Admission medications   Medication Sig Start Date End Date Taking? Authorizing Provider  albuterol (VENTOLIN HFA) 108 (90 Base) MCG/ACT inhaler Inhale 1-2 puffs into the lungs every 6 (six) hours as needed for wheezing. 11/01/20  Yes Wendie Agreste, MD  atorvastatin (LIPITOR) 10 MG tablet Take 1 tablet (10 mg total) by mouth daily. 11/01/20  Yes Wendie Agreste, MD  lisinopril (ZESTRIL) 10 MG tablet Take 1 tablet (10 mg total) by mouth daily. 11/01/20  Yes Wendie Agreste, MD   Social History   Socioeconomic History   Marital status: Single    Spouse name: Not on file   Number of children: Not on file   Years of education: Not on file   Highest education level: Not on file  Occupational History   Not on file  Tobacco Use   Smoking status: Never   Smokeless tobacco: Never  Vaping Use   Vaping Use: Never used  Substance and Sexual Activity   Alcohol use: Yes    Alcohol/week: 1.0 standard drink    Types: 1 Glasses of wine per week   Drug use: No   Sexual activity: Yes  Other Topics Concern   Not on file  Social History Narrative   Not on file   Social Determinants of Health   Financial Resource Strain: Not on file  Food Insecurity: Not on file  Transportation Needs: Not on file  Physical Activity: Not on file  Stress: Not on file  Social Connections: Not on file  Intimate Partner Violence: Not on file    Review of Systems  Constitutional:  Negative for fatigue and unexpected weight change.  Eyes:  Negative for visual disturbance.  Respiratory:  Negative for cough, chest tightness and shortness of breath.   Cardiovascular:  Negative for chest pain, palpitations and leg swelling.  Gastrointestinal:  Negative for abdominal pain and blood in stool.  Neurological:  Negative for dizziness, light-headedness and headaches.    Objective:   Vitals:   09/17/21 1450  BP: 128/74  Pulse: 73  Resp: 16  Temp: 98 F (36.7 C)  TempSrc: Temporal   SpO2: 98%  Weight: 190 lb 9.6 oz (86.5 kg)  Height: '5\' 10"'$  (1.778 m)     Physical Exam Vitals reviewed.  Constitutional:      Appearance: He is well-developed.  HENT:     Head: Normocephalic and atraumatic.  Neck:     Vascular: No carotid bruit or JVD.  Cardiovascular:     Rate and Rhythm: Normal rate and regular rhythm.     Heart sounds: Normal heart sounds. No murmur heard. Pulmonary:     Effort: Pulmonary effort is normal.     Breath sounds: Normal breath sounds. No rales.  Musculoskeletal:     Right lower leg: No edema.     Left lower leg: No edema.  Skin:    General: Skin is warm and dry.  Neurological:     Mental Status: He is alert and oriented to person, place, and time.  Psychiatric:        Mood and Affect: Mood normal.    Assessment & Plan:  Curtis Berger is a 55 y.o. male . Essential hypertension - Plan: lisinopril (ZESTRIL) 10 MG tablet  -  Stable, tolerating current regimen. Medications refilled. Labs pending as above.   Hyperlipidemia, unspecified hyperlipidemia type - Plan: atorvastatin (LIPITOR) 10 MG tablet, Comprehensive metabolic panel, Lipid panel  -  Stable, tolerating current regimen. Medications refilled. Labs pending as above.   Need for influenza vaccination - Plan: Flu Vaccine QUAD 6+ mos PF IM (Fluarix Quad PF)   Meds ordered this encounter  Medications   atorvastatin (LIPITOR) 10 MG tablet    Sig: Take 1 tablet (10 mg total) by mouth daily.    Dispense:  90 tablet    Refill:  3   lisinopril (ZESTRIL) 10 MG tablet    Sig: Take 1 tablet (10 mg total) by mouth daily.    Dispense:  90 tablet    Refill:  3   There are no Patient Instructions on file for this visit.    Signed,   [name], MD [place] 09/17/21 3:47 PM

## 2021-09-18 LAB — COMPREHENSIVE METABOLIC PANEL
ALT: 26 U/L (ref 0–53)
AST: 23 U/L (ref 0–37)
Albumin: 4.5 g/dL (ref 3.5–5.2)
Alkaline Phosphatase: 61 U/L (ref 39–117)
BUN: 19 mg/dL (ref 6–23)
CO2: 28 mEq/L (ref 19–32)
Calcium: 9.4 mg/dL (ref 8.4–10.5)
Chloride: 103 mEq/L (ref 96–112)
Creatinine, Ser: 0.99 mg/dL (ref 0.40–1.50)
GFR: 84.85 mL/min (ref >60.00)
Glucose, Bld: 92 mg/dL (ref 70–99)
Potassium: 4.2 mEq/L (ref 3.5–5.1)
Sodium: 139 mEq/L (ref 135–145)
Total Bilirubin: 0.4 mg/dL (ref 0.2–1.2)
Total Protein: 6.9 g/dL (ref 6.0–8.3)

## 2021-09-18 LAB — LIPID PANEL
Cholesterol: 213 mg/dL — ABNORMAL HIGH (ref 0–200)
HDL: 60.7 mg/dL (ref >39.00)
LDL Cholesterol: 114 mg/dL — ABNORMAL HIGH (ref 0–99)
NonHDL: 151.97
Total CHOL/HDL Ratio: 4
Triglycerides: 189 mg/dL — ABNORMAL HIGH (ref 0.0–149.0)
VLDL: 37.8 mg/dL (ref 0.0–40.0)

## 2022-02-18 ENCOUNTER — Encounter: Payer: Self-pay | Admitting: Family Medicine

## 2022-02-18 ENCOUNTER — Ambulatory Visit (INDEPENDENT_AMBULATORY_CARE_PROVIDER_SITE_OTHER): Payer: 59 | Admitting: Family Medicine

## 2022-02-18 VITALS — BP 122/60 | HR 68 | Temp 97.9°F | Resp 16 | Ht 70.0 in | Wt 198.8 lb

## 2022-02-18 DIAGNOSIS — H66011 Acute suppurative otitis media with spontaneous rupture of ear drum, right ear: Secondary | ICD-10-CM

## 2022-02-18 MED ORDER — AMOXICILLIN 875 MG PO TABS
875.0000 mg | ORAL_TABLET | Freq: Two times a day (BID) | ORAL | 0 refills | Status: AC
Start: 2022-02-18 — End: 2022-02-28

## 2022-02-18 MED ORDER — OFLOXACIN 0.3 % OT SOLN: 10.0000 [drp] | Freq: Every day | OTIC | 0 refills | Status: DC

## 2022-02-18 NOTE — Progress Notes (Signed)
Subjective:  Patient ID: Curtis Berger, male    DOB: 02-Sep-1966  Age: 56 y.o. MRN: [id number]  CC:  Chief Complaint  Patient presents with   Ear Fullness    Pt reports has had Rt ear fullness-had a cold one week ago which has resolved but ear still feels stopped up     HPI Curtis Berger presents for   Right ear fullness: Cold symptoms 1 week ago - Cold symptoms have improved but still some pressure/fullness in his right ear - 11 days ago - clears with blowing nose,  No ear pain, muffled, about 50% hearing. Able to clear with blowing nose, hearing bette for few seconds, then worsens. no fever. Small amt blood and fluid on pillow 9 days ago - felt pain and pop prior. no recent d/c.  Neg covid test recently.  Tx; 1 drop alcohol after shower.     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 No Known Allergies Prior to Admission medications   Medication Sig Start Date End Date Taking? Authorizing Provider  albuterol (VENTOLIN HFA) 108 (90 Base) MCG/ACT inhaler Inhale 1-2 puffs into the lungs every 6 (six) hours as needed for wheezing. 11/01/20  Yes Wendie Agreste, MD  atorvastatin (LIPITOR) 10 MG tablet Take 1 tablet (10 mg total) by mouth daily. 09/17/21  Yes Wendie Agreste, MD  lisinopril (ZESTRIL) 10 MG tablet Take 1 tablet (10 mg total) by mouth daily. 09/17/21  Yes Wendie Agreste, MD   Social History   Socioeconomic History   Marital status: Single    Spouse name: Not on file   Number of children: Not on file   Years of education: Not on file   Highest education level: Not on file  Occupational History   Not on file  Tobacco Use   Smoking status:  Never   Smokeless tobacco: Never  Vaping Use   Vaping Use: Never used  Substance and Sexual Activity   Alcohol use: Yes    Alcohol/week: 1.0 standard drink    Types: 1 Glasses of wine per week   Drug use: No   Sexual activity: Yes  Other Topics Concern   Not on file  Social History Narrative   Not on file   Social Determinants of Health   Financial Resource Strain: Not on file  Food Insecurity: Not on file  Transportation Needs: Not on file  Physical Activity: Not on file  Stress: Not on file  Social Connections: Not on file  Intimate Partner Violence: Not on file    Review of Systems Per HPI.   Objective:   Vitals:   02/18/22 1541  BP: 122/60  Pulse: 68  Resp: 16  Temp: 97.9 F (36.6 C)  TempSrc: Temporal  SpO2: 97%  Weight: 198 lb 12.8 oz (90.2 kg)  Height: 5\' 10"  (1.778 m)     Physical Exam Constitutional:      General: He is not in acute distress.    Appearance: Normal appearance. He is well-developed.  HENT:     Head: Normocephalic and atraumatic.     Ears:     Comments: Left  canal clear, TM pearly gray.  Right canal with dried blood at the distal canal, no apparent abrasion.  No canal wall edema.  Appears to have yellowish-white exudate at the proximal canal, dull erythematous TM with possible rupture at the anterior aspect.  No active bleeding noted.    Nose:     Comments: Sinuses nontender. Cardiovascular:     Rate and Rhythm: Normal rate.  Pulmonary:     Effort: Pulmonary effort is normal.  Neurological:     Mental Status: He is alert and oriented to person, place, and time.  Psychiatric:        Mood and Affect: Mood normal.       Assessment & Plan:  Curtis Berger is a 56 y.o. male . Non-recurrent acute suppurative otitis media of right ear with spontaneous rupture of tympanic membrane - Plan: ofloxacin (FLOXIN OTIC) 0.3 % OTIC solution, amoxicillin (AMOXIL) 875 MG tablet Likely otitis media with rupture, start Floxin otic and  amoxicillin.  Recheck 1 week.  Avoidance of fluid within the ear with showering, use of earplugs discussed.  RTC precautions.  Consider ENT eval if persistent hearing difficulty.  Meds ordered this encounter  Medications   ofloxacin (FLOXIN OTIC) 0.3 % OTIC solution    Sig: Place 10 drops into the right ear daily.    Dispense:  10 mL    Refill:  0   amoxicillin (AMOXIL) 875 MG tablet    Sig: Take 1 tablet (875 mg total) by mouth 2 (two) times daily for 10 days.    Dispense:  20 tablet    Refill:  0   Patient Instructions  I suspect you had a middle ear infection that caused a rupture of the tympanic membrane.  Start oral antibiotics as well as antibiotic drops.  Keep water out of the ear for now with techniques we discussed or with an occlusive earplug.  Recheck in 1 week.  If any worsening symptoms including more pain, fevers, or worsening hearing during that time be seen sooner.  Depending on exam in 1 week we may refer you to ear nose and throat specialist.  Return to the clinic or go to the nearest emergency room if any of your symptoms worsen or new symptoms occur.  Eardrum Rupture, Adult An eardrum rupture is a hole (perforation) in the eardrum. The eardrum is a thin, round tissue inside of the ear that separates the ear canal from the middle ear. The eardrum is also called the tympanic membrane. It transfers sound vibrations through small bones in the middle ear to the hearing nerve in the inner ear. It also protects the middle ear from germs. An eardrum rupture can cause pain and hearing loss. What are the causes? This condition may be caused by: An infection. A sudden injury, such as from: Inserting a thin, sharp object into the ear. A hit to the side of the head, especially by an open hand. Falling onto water or a flat surface. A rapid change in pressure, such as from flying or scuba diving. A sudden increase in pressure against the eardrum, such as from an explosion or a very  loud noise. Inserting a cotton-tipped swab in the ear. A long-term eustachian tube disorder. Eustachian tubes are parts of the body that connect each middle ear space to the back of the nose. A medical procedure or surgery, such as a procedure to remove wax from the ear canal. Removing a pressure equalization tube(PE tube) that was surgically placed  through the eardrum. Having a PE tube fall out. What increases the risk? You are more likely to develop this condition if: You have had PE tubes inserted in your ears. You have an ear infection. You play sports that: Involve balls or contact with other players. Take place in water, such as diving, scuba diving, or waterskiing. What are the signs or symptoms? Symptoms of this condition include: Sudden pain at the time of the injury. Ear pain that suddenly improves. Ringing in the ear after the injury. Drainage from the ear. The drainage may be clear, cloudy or pus-like, or bloody. Hearing loss. Dizziness. How is this diagnosed? This condition is diagnosed based on your symptoms and medical history as well as a physical exam. Your health care provider can usually see a perforation using an ear scope (otoscope). You may have tests, such as: A hearing test (audiogram) to check for hearing loss. A test in which a sample of ear drainage is tested for infection (culture). How is this treated? An eardrum typically heals on its own within a few weeks. If your eardrum does not heal, your health care provider may recommend a procedure to place a patch over your eardrum or surgery to repair your eardrum. Your health care provider may also prescribe antibiotic medicines to help prevent infection. If the ear heals completely, any hearing loss should be temporary. Follow these instructions at home: Medicines Take over-the-counter and prescription medicines only as told by your health care provider. If you were prescribed an antibiotic medicine, use it as  told by your health care provider. Do not stop using the antibiotic even if you start to feel better. Ear care Keep your ear dry. This is very important. Follow instructions from your health care provider about how to keep your ear dry. You may need to wear waterproof earplugs when bathing and swimming. If directed, apply heat to your affected ear as often as told by your health care provider. Use the heat source that your health care provider recommends, such as a moist heat pack or a heating pad. This will help to relieve pain. Place a towel between your skin and the heat source. Leave the heat on for 20-30 minutes. Remove the heat if your skin turns bright red. This is especially important if you are unable to feel pain, heat, or cold. You have a greater risk of getting burned. General instructions Return to sports and activities as told by your health care provider. Ask your health care provider what activities are safe for you. Wear headgear with ear protection when you play sports in which ear injuries are common. Talk to your health care provider before traveling by plane. Keep all follow-up visits. This is important. Contact a health care provider if: You have a fever. You have ear pain. You have mucus or blood draining from your ear. You have hearing loss, dizziness, or ringing in your ear. Get help right away if: You have sudden hearing loss. You are very dizzy. You have severe ear pain. Your face feels weak or becomes limp (paralyzed). These symptoms may represent a serious problem that is an emergency. Do not wait to see if the symptoms will go away. Get medical help right away. Call your local emergency services (911 in the [place] Do not drive yourself to the hospital. Summary An eardrum rupture is a hole (perforation) in the eardrum that can cause pain and hearing loss. It is usually caused by a sudden injury to the ear.  The eardrum will likely heal on its own within a few  weeks. In some cases, surgery may be necessary. Follow instructions from your health care provider about how to keep your ear dry as it heals. This information is not intended to replace advice given to you by your health care provider. Make sure you discuss any questions you have with your health care provider. Document Revised: 11/06/2020 Document Reviewed: 11/06/2020 Elsevier Patient Education  2022 [place],   [name], MD [place] 02/18/22 5:31 PM

## 2022-02-18 NOTE — Patient Instructions (Signed)
I suspect you had a middle ear infection that caused a rupture of the tympanic membrane.  Start oral antibiotics as well as antibiotic drops.  Keep water out of the ear for now with techniques we discussed or with an occlusive earplug.  Recheck in 1 week.  If any worsening symptoms including more pain, fevers, or worsening hearing during that time be seen sooner.  Depending on exam in 1 week we may refer you to ear nose and throat specialist.  Return to the clinic or go to the nearest emergency room if any of your symptoms worsen or new symptoms occur.  Eardrum Rupture, Adult An eardrum rupture is a hole (perforation) in the eardrum. The eardrum is a thin, round tissue inside of the ear that separates the ear canal from the middle ear. The eardrum is also called the tympanic membrane. It transfers sound vibrations through small bones in the middle ear to the hearing nerve in the inner ear. It also protects the middle ear from germs. An eardrum rupture can cause pain and hearing loss. What are the causes? This condition may be caused by: An infection. A sudden injury, such as from: Inserting a thin, sharp object into the ear. A hit to the side of the head, especially by an open hand. Falling onto water or a flat surface. A rapid change in pressure, such as from flying or scuba diving. A sudden increase in pressure against the eardrum, such as from an explosion or a very loud noise. Inserting a cotton-tipped swab in the ear. A long-term eustachian tube disorder. Eustachian tubes are parts of the body that connect each middle ear space to the back of the nose. A medical procedure or surgery, such as a procedure to remove wax from the ear canal. Removing a pressure equalization tube(PE tube) that was surgically placed through the eardrum. Having a PE tube fall out. What increases the risk? You are more likely to develop this condition if: You have had PE tubes inserted in your ears. You have an  ear infection. You play sports that: Involve balls or contact with other players. Take place in water, such as diving, scuba diving, or waterskiing. What are the signs or symptoms? Symptoms of this condition include: Sudden pain at the time of the injury. Ear pain that suddenly improves. Ringing in the ear after the injury. Drainage from the ear. The drainage may be clear, cloudy or pus-like, or bloody. Hearing loss. Dizziness. How is this diagnosed? This condition is diagnosed based on your symptoms and medical history as well as a physical exam. Your health care provider can usually see a perforation using an ear scope (otoscope). You may have tests, such as: A hearing test (audiogram) to check for hearing loss. A test in which a sample of ear drainage is tested for infection (culture). How is this treated? An eardrum typically heals on its own within a few weeks. If your eardrum does not heal, your health care provider may recommend a procedure to place a patch over your eardrum or surgery to repair your eardrum. Your health care provider may also prescribe antibiotic medicines to help prevent infection. If the ear heals completely, any hearing loss should be temporary. Follow these instructions at home: Medicines Take over-the-counter and prescription medicines only as told by your health care provider. If you were prescribed an antibiotic medicine, use it as told by your health care provider. Do not stop using the antibiotic even if you start to feel  better. Ear care Keep your ear dry. This is very important. Follow instructions from your health care provider about how to keep your ear dry. You may need to wear waterproof earplugs when bathing and swimming. If directed, apply heat to your affected ear as often as told by your health care provider. Use the heat source that your health care provider recommends, such as a moist heat pack or a heating pad. This will help to relieve  pain. Place a towel between your skin and the heat source. Leave the heat on for 20-30 minutes. Remove the heat if your skin turns bright red. This is especially important if you are unable to feel pain, heat, or cold. You have a greater risk of getting burned. General instructions Return to sports and activities as told by your health care provider. Ask your health care provider what activities are safe for you. Wear headgear with ear protection when you play sports in which ear injuries are common. Talk to your health care provider before traveling by plane. Keep all follow-up visits. This is important. Contact a health care provider if: You have a fever. You have ear pain. You have mucus or blood draining from your ear. You have hearing loss, dizziness, or ringing in your ear. Get help right away if: You have sudden hearing loss. You are very dizzy. You have severe ear pain. Your face feels weak or becomes limp (paralyzed). These symptoms may represent a serious problem that is an emergency. Do not wait to see if the symptoms will go away. Get medical help right away. Call your local emergency services (911 in the [place] Do not drive yourself to the hospital. Summary An eardrum rupture is a hole (perforation) in the eardrum that can cause pain and hearing loss. It is usually caused by a sudden injury to the ear. The eardrum will likely heal on its own within a few weeks. In some cases, surgery may be necessary. Follow instructions from your health care provider about how to keep your ear dry as it heals. This information is not intended to replace advice given to you by your health care provider. Make sure you discuss any questions you have with your health care provider. Document Revised: 11/06/2020 Document Reviewed: 11/06/2020 Elsevier Patient Education  2022 [employer].

## 2022-02-27 ENCOUNTER — Encounter: Payer: Self-pay | Admitting: Family Medicine

## 2022-02-27 ENCOUNTER — Ambulatory Visit (INDEPENDENT_AMBULATORY_CARE_PROVIDER_SITE_OTHER): Payer: 59 | Admitting: Family Medicine

## 2022-02-27 VITALS — BP 128/76 | HR 90 | Temp 97.8°F | Resp 18 | Ht 70.0 in | Wt 197.0 lb

## 2022-02-27 DIAGNOSIS — H66011 Acute suppurative otitis media with spontaneous rupture of ear drum, right ear: Secondary | ICD-10-CM | POA: Diagnosis not present

## 2022-02-27 NOTE — Progress Notes (Signed)
? ?Subjective:  ?Patient ID: Curtis Berger, male    DOB: 1966-08-02  Age: 56 y.o. MRN: [id number] ? ?CC:  ?Chief Complaint  ?Patient presents with  ? Ear Problem  ?  Pt here for recheck on Rt ear, notes feels better, no concerns from pt.   ? ? ?HPI ?Curtis Berger presents for  ? ?Otitis media with rupture ?Follow-up from 02/18/2022.  Started on Floxin otic, amoxicillin for suspected otitis media with rupture.  Discussed avoiding fluid within the ear, use of earplugs. ?Doing better, using drops, amoxicillin.  ?Feels better, no pain, still muffled. Hearing about 75% better (vs 25% last visit).  ?No drainage.  ? ?History ?Patient Active Problem List  ? Diagnosis Date Noted  ? Right shoulder pain 09/17/2016  ? 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 HTN (hypertension) 10/29/2012  ? Lipid disorder 10/29/2012  ? ?Past Medical History:  ?Diagnosis Date  ? Allergy   ? Asthma   ? Hyperlipidemia   ? Hypertension   ? ?No past surgical history on file. ?No Known Allergies ?Prior to Admission medications   ?Medication Sig Start Date End Date Taking? Authorizing Provider  ?albuterol (VENTOLIN HFA) 108 (90 Base) MCG/ACT inhaler Inhale 1-2 puffs into the lungs every 6 (six) hours as needed for wheezing. 11/01/20  Yes Wendie Agreste, MD  ?amoxicillin (AMOXIL) 875 MG tablet Take 1 tablet (875 mg total) by mouth 2 (two) times daily for 10 days. 02/18/22 02/28/22 Yes Wendie Agreste, MD  ?atorvastatin (LIPITOR) 10 MG tablet Take 1 tablet (10 mg total) by mouth daily. 09/17/21  Yes Wendie Agreste, MD  ?lisinopril (ZESTRIL) 10 MG tablet Take 1 tablet (10 mg total) by mouth daily. 09/17/21  Yes Wendie Agreste, MD  ?ofloxacin (FLOXIN OTIC) 0.3 % OTIC solution Place 10 drops into the right ear daily. 02/18/22  Yes Wendie Agreste, MD  ? ?Social History  ? ?Socioeconomic History  ? Marital status: Single  ?  Spouse name: Not on file  ? Number of children: Not on file  ? Years of education: Not on  file  ? Highest education level: Not on file  ?Occupational History  ? Not on file  ?Tobacco Use  ? Smoking status: Never  ? Smokeless tobacco: Never  ?Vaping Use  ? Vaping Use: Never used  ?Substance and Sexual Activity  ? Alcohol use: Yes  ?  Alcohol/week: 1.0 standard drink  ?  Types: 1 Glasses of wine per week  ? Drug use: No  ? Sexual activity: Yes  ?Other Topics Concern  ? Not on file  ?Social History Narrative  ? Not on file  ? ?Social Determinants of Health  ? ?Financial Resource Strain: Not on file  ?Food Insecurity: Not on file  ?Transportation Needs: Not on file  ?Physical Activity: Not on file  ?Stress: Not on file  ?Social Connections: Not on file  ?Intimate Partner Violence: Not on file  ? ? ?Review of Systems ?Per HPI.  ?Objective:  ? ?Vitals:  ? 02/27/22 1558  ?BP: 128/76  ?Pulse: 90  ?Resp: 18  ?Temp: 97.8 ?F (36.6 ?C)  ?TempSrc: Temporal  ?SpO2: 97%  ?Weight: 197 lb (89.4 kg)  ?Height: 5\' 10"  (1.778 m)  ? ?Gross hearing test. Whispered words at 10 ft - 3/3 on left and right.  ? ?Physical Exam ?Constitutional:   ?   General: He is not in acute distress. ?   Appearance: Normal appearance. He is well-developed.  ?  HENT:  ?   Head: Normocephalic and atraumatic.  ?   Left Ear: Tympanic membrane, ear canal and external ear normal. There is no impacted cerumen.  ?   Ears:  ?   Comments: Right canal with dried blood at external portion of canal.  Difficulty visualizing TM but appears to have yellow discharge adherent to TM, and dried.  Difficulty seen anterior aspect of TM but no visible rupture or active discharge.  No active bleeding. ?   Nose: Nose normal.  ?   Mouth/Throat:  ?   Mouth: Mucous membranes are moist.  ?Cardiovascular:  ?   Rate and Rhythm: Normal rate.  ?Pulmonary:  ?   Effort: Pulmonary effort is normal.  ?Lymphadenopathy:  ?   Cervical: No cervical adenopathy.  ?Neurological:  ?   Mental Status: He is alert and oriented to person, place, and time.  ?Psychiatric:     ?   Mood and Affect:  Mood normal.  ? ? ? ? ? ?Assessment & Plan:  ?Curtis Berger is a 56 y.o. male . ?Non-recurrent acute suppurative otitis media of right ear with spontaneous rupture of tympanic membrane - Plan: Ambulatory referral to ENT ?Symptomatically improving.  Still with discolored discharge versus suppurative otitis media on right.  Completing antibiotics.  No active discharge or bleeding.  Some difficulty visualizing TM.  Will refer to ENT, continue Floxin otic for now with RTC precautions if any worsening symptoms prior to ENT eval. ? ?No orders of the defined types were placed in this encounter. ? ?Patient Instructions  ?Continue oral antibiotics until completion, then continue drops for additional 1 week or until seen by ENT.  ?If any worsening symptoms be seen. Thanks for coming in today.  ? ? ? ?Signed,  ? ?[name], MD ?[place] ?[facility] ?02/27/22 ?5:03 PM ? ? ?

## 2022-02-27 NOTE — Patient Instructions (Signed)
Continue oral antibiotics until completion, then continue drops for additional 1 week or until seen by ENT.  ?If any worsening symptoms be seen. Thanks for coming in today.  ?

## 2022-03-21 ENCOUNTER — Ambulatory Visit (INDEPENDENT_AMBULATORY_CARE_PROVIDER_SITE_OTHER): Payer: 59 | Admitting: Family Medicine

## 2022-03-21 ENCOUNTER — Encounter: Payer: Self-pay | Admitting: Family Medicine

## 2022-03-21 VITALS — BP 118/72 | HR 71 | Temp 98.0°F | Resp 17 | Ht 70.0 in | Wt 196.8 lb

## 2022-03-21 DIAGNOSIS — Z0000 Encounter for general adult medical examination without abnormal findings: Secondary | ICD-10-CM

## 2022-03-21 DIAGNOSIS — Z131 Encounter for screening for diabetes mellitus: Secondary | ICD-10-CM | POA: Diagnosis not present

## 2022-03-21 DIAGNOSIS — Z1211 Encounter for screening for malignant neoplasm of colon: Secondary | ICD-10-CM

## 2022-03-21 DIAGNOSIS — J4520 Mild intermittent asthma, uncomplicated: Secondary | ICD-10-CM

## 2022-03-21 DIAGNOSIS — I10 Essential (primary) hypertension: Secondary | ICD-10-CM

## 2022-03-21 DIAGNOSIS — Z130 Encounter for screening for diseases of the blood and blood-forming organs and certain disorders involving the immune mechanism: Secondary | ICD-10-CM | POA: Diagnosis not present

## 2022-03-21 DIAGNOSIS — Z125 Encounter for screening for malignant neoplasm of prostate: Secondary | ICD-10-CM

## 2022-03-21 DIAGNOSIS — E785 Hyperlipidemia, unspecified: Secondary | ICD-10-CM

## 2022-03-21 LAB — COMPREHENSIVE METABOLIC PANEL
ALT: 34 U/L (ref 0–53)
AST: 29 U/L (ref 0–37)
Albumin: 4.6 g/dL (ref 3.5–5.2)
Alkaline Phosphatase: 62 U/L (ref 39–117)
BUN: 14 mg/dL (ref 6–23)
CO2: 28 mEq/L (ref 19–32)
Calcium: 9.2 mg/dL (ref 8.4–10.5)
Chloride: 103 mEq/L (ref 96–112)
Creatinine, Ser: 0.95 mg/dL (ref 0.40–1.50)
GFR: 88.84 mL/min (ref >60.00)
Glucose, Bld: 102 mg/dL — ABNORMAL HIGH (ref 70–99)
Potassium: 4.4 mEq/L (ref 3.5–5.1)
Sodium: 139 mEq/L (ref 135–145)
Total Bilirubin: 0.7 mg/dL (ref 0.2–1.2)
Total Protein: 6.7 g/dL (ref 6.0–8.3)

## 2022-03-21 LAB — PSA: PSA: 3.3 ng/mL (ref 0.10–4.00)

## 2022-03-21 LAB — CBC WITH DIFFERENTIAL/PLATELET
Basophils Absolute: 0 10*3/uL (ref 0.0–0.1)
Basophils Relative: 0.6 % (ref 0.0–3.0)
Eosinophils Absolute: 0.1 10*3/uL (ref 0.0–0.7)
Eosinophils Relative: 1.9 % (ref 0.0–5.0)
HCT: 41.6 % (ref 39.0–52.0)
Hemoglobin: 14.8 g/dL (ref 13.0–17.0)
Lymphocytes Relative: 23.8 % (ref 12.0–46.0)
Lymphs Abs: 1 10*3/uL (ref 0.7–4.0)
MCHC: 35.6 g/dL (ref 30.0–36.0)
MCV: 86.2 fl (ref 78.0–100.0)
Monocytes Absolute: 0.6 10*3/uL (ref 0.1–1.0)
Monocytes Relative: 13.9 % — ABNORMAL HIGH (ref 3.0–12.0)
Neutro Abs: 2.5 10*3/uL (ref 1.4–7.7)
Neutrophils Relative %: 59.8 % (ref 43.0–77.0)
Platelets: 188 10*3/uL (ref 150.0–400.0)
RBC: 4.82 Mil/uL (ref 4.22–5.81)
RDW: 13.5 % (ref 11.5–15.5)
WBC: 4.1 10*3/uL (ref 4.0–10.5)

## 2022-03-21 LAB — HEMOGLOBIN A1C: Hgb A1c MFr Bld: 5.5 % (ref 4.6–6.5)

## 2022-03-21 LAB — LIPID PANEL
Cholesterol: 213 mg/dL — ABNORMAL HIGH (ref 0–200)
HDL: 63.1 mg/dL (ref >39.00)
LDL Cholesterol: 116 mg/dL — ABNORMAL HIGH (ref 0–99)
NonHDL: 150.01
Total CHOL/HDL Ratio: 3
Triglycerides: 169 mg/dL — ABNORMAL HIGH (ref 0.0–149.0)
VLDL: 33.8 mg/dL (ref 0.0–40.0)

## 2022-03-21 MED ORDER — ALBUTEROL SULFATE HFA 108 (90 BASE) MCG/ACT IN AERS: 1.0000 | INHALATION_SPRAY | Freq: Four times a day (QID) | RESPIRATORY_TRACT | 1 refills | Status: DC | PRN

## 2022-03-21 MED ORDER — ATORVASTATIN CALCIUM 10 MG PO TABS: 10.0000 mg | ORAL_TABLET | Freq: Every day | ORAL | 3 refills | Status: DC

## 2022-03-21 MED ORDER — LISINOPRIL 10 MG PO TABS: 10.0000 mg | ORAL_TABLET | Freq: Every day | ORAL | 3 refills | Status: DC

## 2022-03-21 NOTE — Progress Notes (Signed)
? ?Subjective:  ?Patient ID: Curtis Berger, male    DOB: [birth year]  Age: 56 y.o. MRN: [id number] ? ?CC:  ?Chief Complaint  ?Patient presents with  ? Annual Exam  ?  Pt here for physical, due for some maintenance refills within 6 months   ? ? ?HPI ?[name] presents for Annual Exam ? ?Hypertension: ?Lisinopril 10 mg daily ?Home readings:none. No new side effects. ?BP Readings from Last 3 Encounters:  ?03/21/22 118/72  ?02/27/22 128/76  ?02/18/22 122/60  ? ?Lab Results  ?Component Value Date  ? CREATININE 0.99 09/17/2021  ? ?Mild intermittent asthma ?Treated with albuterol as needed. ?This time of year few ties per week - allergy season. Not needed outside outside of allergy season. Last rx in 2021.  ? ?Hyperlipidemia: ?Lipitor 10 mg daily, no new myalgias. Not fasting last visit. Fasting today.  ?Lab Results  ?Component Value Date  ? CHOL 213 (H) 09/17/2021  ? HDL 60.70 09/17/2021  ? LDLCALC 114 (H) 09/17/2021  ? TRIG 189.0 (H) 09/17/2021  ? CHOLHDL 4 09/17/2021  ? ?Lab Results  ?Component Value Date  ? ALT 26 09/17/2021  ? AST 23 09/17/2021  ? ALKPHOS 61 09/17/2021  ? BILITOT 0.4 09/17/2021  ? ? ? ? ?  03/21/2022  ?  8:10 AM 02/18/2022  ?  3:43 PM 09/17/2021  ?  2:52 PM 02/16/2021  ?  8:26 AM 02/16/2021  ?  8:17 AM  ?Depression screen PHQ 2/9  ?Decreased Interest 0 0 0 0 0  ?Down, Depressed, Hopeless 0 0 0 0 0  ?PHQ - 2 Score 0 0 0 0 0  ?Altered sleeping 0      ?Tired, decreased energy 0      ?Change in appetite 0      ?Feeling bad or failure about yourself  0      ?Trouble concentrating 0      ?Moving slowly or fidgety/restless 0      ?Suicidal thoughts 0      ?PHQ-9 Score 0      ? ? ?Health Maintenance  ?Topic Date Due  ? COVID-19 Vaccine (3 - Booster for Pfizer series) 04/06/2022 (Originally 06/26/2020)  ? Zoster Vaccines- Shingrix (1 of 2) 06/21/2022 (Originally 09/12/2014)  ? COLONOSCOPY (Pts 45-34yr Insurance coverage will need to be confirmed)  09/17/2022 (Originally 09/12/2009)  ? TETANUS/TDAP   10/23/2028  ? INFLUENZA VACCINE  Completed  ? Hepatitis C Screening  Completed  ? HIV Screening  Completed  ? HPV VACCINES  Aged Out  ?Has not completed cologuard. Would like to proceed with colonoscopy.  ? ?Prostate: does not have family history of prostate cancer ?The natural history of prostate cancer and ongoing controversy regarding screening and potential treatment outcomes of prostate cancer has been discussed with the patient. The meaning of a false positive PSA and a false negative PSA has been discussed. He indicates understanding of the limitations of this screening test and wishes  to proceed with screening PSA testing. ?Lab Results  ?Component Value Date  ? PSA1 2.5 02/12/2021  ? PSA1 1.2 10/23/2018  ? PSA1 1.3 10/07/2017  ? PSA 1.00 03/16/2016  ? PSA 0.98 11/08/2013  ? PSA 1.15 10/29/2012  ? ? ? ? ?Immunization History  ?Administered Date(s) Administered  ? Influenza Split 10/29/2012, 10/17/2014  ? Influenza,inj,Quad PF,6+ Mos 11/08/2013, 10/07/2017, 10/23/2018, 11/17/2019, 11/01/2020, 09/17/2021  ? Influenza-Unspecified 10/30/2014  ? PFIZER(Purple Top)SARS-COV-2 Vaccination 04/07/2020, 05/01/2020  ? Tdap 12/31/2007, 10/23/2018  ?Shingrix: declines until age 58  ? ?  Vision Screening  ? Right eye Left eye Both eyes  ?Without correction     ?With correction 20/20-1 20/20-1 20/20  ?Optometry - last year.  ? ?Dental:Yes and Within Last 6 months ? ?Alcohol: 1 per week.  ? ?Tobacco: none.  ? ?Exercise:Home exercise routine includes HIIT, cardio, yoga. . Gym/ health club routine includes HIIT, strength training - over 171mn per week. . ? ?No STI testing needed.  ? ?History ?Patient Active Problem List  ? Diagnosis Date Noted  ? Right shoulder pain 09/17/2016  ? 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 HTN (hypertension) 10/29/2012  ? Lipid disorder 10/29/2012  ? ?Past Medical History:  ?Diagnosis Date  ? Allergy   ? Asthma   ? Hyperlipidemia   ? Hypertension   ? ?History reviewed. No  pertinent surgical history. ?No Known Allergies ?Prior to Admission medications   ?Medication Sig Start Date End Date Taking? Authorizing Provider  ?albuterol (VENTOLIN HFA) 108 (90 Base) MCG/ACT inhaler Inhale 1-2 puffs into the lungs every 6 (six) hours as needed for wheezing. 11/01/20  Yes GWendie Agreste MD  ?atorvastatin (LIPITOR) 10 MG tablet Take 1 tablet (10 mg total) by mouth daily. 09/17/21  Yes GWendie Agreste MD  ?lisinopril (ZESTRIL) 10 MG tablet Take 1 tablet (10 mg total) by mouth daily. 09/17/21  Yes GWendie Agreste MD  ? ?Social History  ? ?Socioeconomic History  ? Marital status: Single  ?  Spouse name: Not on file  ? Number of children: Not on file  ? Years of education: Not on file  ? Highest education level: Not on file  ?Occupational History  ? Not on file  ?Tobacco Use  ? Smoking status: Never  ? Smokeless tobacco: Never  ?Vaping Use  ? Vaping Use: Never used  ?Substance and Sexual Activity  ? Alcohol use: Yes  ?  Alcohol/week: 1.0 standard drink  ?  Types: 1 Glasses of wine per week  ? Drug use: No  ? Sexual activity: Yes  ?Other Topics Concern  ? Not on file  ?Social History Narrative  ? Not on file  ? ?Social Determinants of Health  ? ?Financial Resource Strain: Not on file  ?Food Insecurity: Not on file  ?Transportation Needs: Not on file  ?Physical Activity: Not on file  ?Stress: Not on file  ?Social Connections: Not on file  ?Intimate Partner Violence: Not on file  ? ? ?Review of Systems ? ?13 point review of systems per patient health survey noted.  Negative other than as indicated above or in HPI.  ? ?Objective:  ? ?Vitals:  ? 03/21/22 0802  ?BP: 118/72  ?Pulse: 71  ?Resp: 17  ?Temp: 98 ?F (36.7 ?C)  ?TempSrc: Temporal  ?SpO2: 99%  ?Weight: 196 lb 12.8 oz (89.3 kg)  ?Height: '5\' 10"'$  (1.778 m)  ? ? ?Physical Exam ?Vitals reviewed.  ?Constitutional:   ?   Appearance: He is well-developed.  ?HENT:  ?   Head: Normocephalic and atraumatic.  ?   Right Ear: External ear normal.  ?   Left  Ear: External ear normal.  ?Eyes:  ?   Conjunctiva/sclera: Conjunctivae normal.  ?   Pupils: Pupils are equal, round, and reactive to light.  ?Neck:  ?   Thyroid: No thyromegaly.  ?Cardiovascular:  ?   Rate and Rhythm: Normal rate and regular rhythm.  ?   Heart sounds: Normal heart sounds.  ?Pulmonary:  ?   Effort: Pulmonary effort is normal.  No respiratory distress.  ?   Breath sounds: Normal breath sounds. No wheezing.  ?Abdominal:  ?   General: There is no distension.  ?   Palpations: Abdomen is soft.  ?   Tenderness: There is no abdominal tenderness.  ?Musculoskeletal:     ?   General: No tenderness. Normal range of motion.  ?   Cervical back: Normal range of motion and neck supple.  ?Lymphadenopathy:  ?   Cervical: No cervical adenopathy.  ?Skin: ?   General: Skin is warm and dry.  ?Neurological:  ?   Mental Status: He is alert and oriented to person, place, and time.  ?   Deep Tendon Reflexes: Reflexes are normal and symmetric.  ?Psychiatric:     ?   Behavior: Behavior normal.  ? ? ?Assessment & Plan:  ?KAVEN CUMBIE is a 56 y.o. male . ?Annual physical exam - Plan: Lipid panel, Hemoglobin A1c, CBC with Differential/Platelet, Comprehensive metabolic panel ? - -anticipatory guidance as below in AVS, screening labs above. Health maintenance items as above in HPI discussed/recommended as applicable.  ? ?Hyperlipidemia, unspecified hyperlipidemia type - Plan: Lipid panel, atorvastatin (LIPITOR) 10 MG tablet ? -  Stable, tolerating current regimen. Medications refilled. Labs pending as above.  ? ?Essential hypertension - Plan: Comprehensive metabolic panel, lisinopril (ZESTRIL) 10 MG tablet ? -  Stable, tolerating current regimen. Medications refilled. Labs pending as above.  ? ?Screening for deficiency anemia - Plan: CBC with Differential/Platelet ? ?Screening for diabetes mellitus - Plan: Hemoglobin A1c ? ?Mild intermittent asthma without complication - Plan: albuterol (VENTOLIN HFA) 108 (90 Base) MCG/ACT  inhaler ? - recent increased use with allergy season. Option to add ICS, continue albuterol for now with rtc precautions. ?  ?Screen for colon cancer - Plan: Ambulatory referral to Gastroenterology ? ?S

## 2022-03-21 NOTE — Patient Instructions (Signed)
No change in meds for now.  I will refer you for the colonoscopy.  Albuterol if needed but if you do require that more than a few times per week, or daily need let me know and we can add another medication temporarily.  I expect symptoms to improve as allergies calm down.  If any concerns on labs I will let you know.  Follow-up for a physical in 1 year unless there are questions sooner.  Thanks for coming in today. ? ?Preventive Care 58-58 Years Old, Male ?Preventive care refers to lifestyle choices and visits with your health care provider that can promote health and wellness. Preventive care visits are also called wellness exams. ?What can I expect for my preventive care visit? ?Counseling ?During your preventive care visit, your health care provider may ask about your: ?Medical history, including: ?Past medical problems. ?Family medical history. ?Current health, including: ?Emotional well-being. ?Home life and relationship well-being. ?Sexual activity. ?Lifestyle, including: ?Alcohol, nicotine or tobacco, and drug use. ?Access to firearms. ?Diet, exercise, and sleep habits. ?Safety issues such as seatbelt and bike helmet use. ?Sunscreen use. ?Work and work Statistician. ?Physical exam ?Your health care provider will check your: ?Height and weight. These may be used to calculate your BMI (body mass index). BMI is a measurement that tells if you are at a healthy weight. ?Waist circumference. This measures the distance around your waistline. This measurement also tells if you are at a healthy weight and may help predict your risk of certain diseases, such as type 2 diabetes and high blood pressure. ?Heart rate and blood pressure. ?Body temperature. ?Skin for abnormal spots. ?What immunizations do I need? ?Vaccines are usually given at various ages, according to a schedule. Your health care provider will recommend vaccines for you based on your age, medical history, and lifestyle or other factors, such as travel or  where you work. ?What tests do I need? ?Screening ?Your health care provider may recommend screening tests for certain conditions. This may include: ?Lipid and cholesterol levels. ?Diabetes screening. This is done by checking your blood sugar (glucose) after you have not eaten for a while (fasting). ?Hepatitis B test. ?Hepatitis C test. ?HIV (human immunodeficiency virus) test. ?STI (sexually transmitted infection) testing, if you are at risk. ?Lung cancer screening. ?Prostate cancer screening. ?Colorectal cancer screening. ?Talk with your health care provider about your test results, treatment options, and if necessary, the need for more tests. ?Follow these instructions at home: ?Eating and drinking ? ?Eat a diet that includes fresh fruits and vegetables, whole grains, lean protein, and low-fat dairy products. ?Take vitamin and mineral supplements as recommended by your health care provider. ?Do not drink alcohol if your health care provider tells you not to drink. ?If you drink alcohol: ?Limit how much you have to 0-2 drinks a day. ?Know how much alcohol is in your drink. In the [place] one drink equals one 12 oz bottle of beer (355 mL), one 5 oz glass of wine (148 mL), or one 1? oz glass of hard liquor (44 mL). ?Lifestyle ?Brush your teeth every morning and night with fluoride toothpaste. Floss one time each day. ?Exercise for at least 30 minutes 5 or more days each week. ?Do not use any products that contain nicotine or tobacco. These products include cigarettes, chewing tobacco, and vaping devices, such as e-cigarettes. If you need help quitting, ask your health care provider. ?Do not use drugs. ?If you are sexually active, practice safe sex. Use a condom or other  form of protection to prevent STIs. ?Take aspirin only as told by your health care provider. Make sure that you understand how much to take and what form to take. Work with your health care provider to find out whether it is safe and beneficial for you  to take aspirin daily. ?Find healthy ways to manage stress, such as: ?Meditation, yoga, or listening to music. ?Journaling. ?Talking to a trusted person. ?Spending time with friends and family. ?Minimize exposure to UV radiation to reduce your risk of skin cancer. ?Safety ?Always wear your seat belt while driving or riding in a vehicle. ?Do not drive: ?If you have been drinking alcohol. Do not ride with someone who has been drinking. ?When you are tired or distracted. ?While texting. ?If you have been using any mind-altering substances or drugs. ?Wear a helmet and other protective equipment during sports activities. ?If you have firearms in your house, make sure you follow all gun safety procedures. ?What's next? ?Go to your health care provider once a year for an annual wellness visit. ?Ask your health care provider how often you should have your eyes and teeth checked. ?Stay up to date on all vaccines. ?This information is not intended to replace advice given to you by your health care provider. Make sure you discuss any questions you have with your health care provider. ?Document Revised: 06/13/2021 Document Reviewed: 06/13/2021 ?Elsevier Patient Education ? [employer]. ? ?

## 2022-03-27 ENCOUNTER — Telehealth: Payer: Self-pay

## 2022-03-27 DIAGNOSIS — R972 Elevated prostate specific antigen [PSA]: Secondary | ICD-10-CM

## 2022-03-27 NOTE — Telephone Encounter (Signed)
-----   Message from Wendie Agreste, MD sent at 03/26/2022 12:45 PM EDT ----- ?Call patient ? ?Prostate test was in the normal range, but high normal range and has increased some from last year.  That was a slight increase from a few years prior.  I would recommend evaluation with urology to determine if other testing needed or more close monitoring of this level.  Let me know if that is okay and I can send a referral.  If he has questions, I am happy to call him. ? ?Cholesterol levels were stable with slight improvement of triglycerides.  Total cholesterol is the same.  LDL cholesterol is similar to prior readings.  Blood counts overall looked okay.  Mild elevated monocytes not concerning as other readings were normal.  Blood sugar a few points elevated but overall electrolytes looked okay.  32-monthblood sugar test was normal. ?

## 2022-03-28 NOTE — Telephone Encounter (Signed)
Called pt and did inform he voiced understanding and is willing to see urology for PSA level elevation ? ?

## 2022-03-28 NOTE — Telephone Encounter (Signed)
Sounds good.  Referral ordered. ?

## 2022-03-28 NOTE — Addendum Note (Signed)
Addended by: [name] R on: 03/28/2022 06:55 PM ? ? Modules accepted: Orders ? ?

## 2022-05-20 ENCOUNTER — Encounter: Payer: Self-pay | Admitting: Gastroenterology

## 2022-05-20 ENCOUNTER — Ambulatory Visit (AMBULATORY_SURGERY_CENTER): Payer: 59

## 2022-05-20 VITALS — Ht 70.0 in | Wt 192.0 lb

## 2022-05-20 DIAGNOSIS — Z1211 Encounter for screening for malignant neoplasm of colon: Secondary | ICD-10-CM

## 2022-05-20 MED ORDER — NA SULFATE-K SULFATE-MG SULF 17.5-3.13-1.6 GM/177ML PO SOLN: 1.0000 | Freq: Once | ORAL | 0 refills | Status: AC

## 2022-05-20 NOTE — Progress Notes (Signed)
No egg or soy allergy known to patient  No issues known to pt with past sedation with any surgeries or procedures Patient denies ever being told they had issues or difficulty with intubation  No FH of Malignant Hyperthermia Pt is not on diet pills Pt is not on home 02  Pt is not on blood thinners  Pt denies issues with constipation at this time;  No A fib or A flutter NO PA's for preps discussed with pt in PV today  Discussed with pt there will be an out-of-pocket cost for prep and that varies from $0 to 70 + dollars - pt verbalized understanding  Pt instructed to use Singlecare.com or GoodRx for a price reduction on prep  PV completed over the phone. Pt verified name, DOB, address and insurance during PV today.  Pt requested to come pick up instruction packet with copy of consent form to read and not return.  Pt encouraged to call with questions or issues.   Insurance confirmed with pt at [facility] today

## 2022-05-24 ENCOUNTER — Encounter: Payer: Self-pay | Admitting: Gastroenterology

## 2022-05-24 ENCOUNTER — Ambulatory Visit (AMBULATORY_SURGERY_CENTER): Payer: 59 | Admitting: Gastroenterology

## 2022-05-24 VITALS — BP 120/69 | HR 60 | Temp 96.9°F | Resp 11 | Ht 70.0 in | Wt 192.0 lb

## 2022-05-24 DIAGNOSIS — Z1211 Encounter for screening for malignant neoplasm of colon: Secondary | ICD-10-CM | POA: Diagnosis present

## 2022-05-24 DIAGNOSIS — D123 Benign neoplasm of transverse colon: Secondary | ICD-10-CM

## 2022-05-24 DIAGNOSIS — D128 Benign neoplasm of rectum: Secondary | ICD-10-CM | POA: Diagnosis not present

## 2022-05-24 DIAGNOSIS — D125 Benign neoplasm of sigmoid colon: Secondary | ICD-10-CM | POA: Diagnosis not present

## 2022-05-24 MED ORDER — SODIUM CHLORIDE 0.9 % IV SOLN: 500.0000 mL | Freq: Once | INTRAVENOUS | Status: DC

## 2022-05-24 NOTE — Progress Notes (Signed)
A/ox3, pleased with MAC, report to RN 

## 2022-05-24 NOTE — Patient Instructions (Signed)

## 2022-05-24 NOTE — Progress Notes (Signed)
Pt's states no medical or surgical changes since previsit or office visit. 

## 2022-05-24 NOTE — Progress Notes (Signed)
[facility] History and Physical   Primary Care Physician:  Wendie Agreste, MD   Reason for Procedure:   Colon cancer screening  Plan:    Screening colonoscopy     HPI: Curtis Berger is a 56 y.o. male undergoing initial average risk screening colonoscopy.  He has no family history of colon cancer and no chronic GI symptoms.    Past Medical History:  Diagnosis Date   Allergy    Asthma    seasonal use   Hyperlipidemia    on meds   Hypertension    on meds    Past Surgical History:  Procedure Laterality Date   NASAL SEPTUM SURGERY     SHOULDER SURGERY Right    SHOULDER SURGERY Left     Prior to Admission medications   Medication Sig Start Date End Date Taking? Authorizing Provider  atorvastatin (LIPITOR) 10 MG tablet Take 1 tablet (10 mg total) by mouth daily. 03/21/22  Yes Wendie Agreste, MD  lisinopril (ZESTRIL) 10 MG tablet Take 1 tablet (10 mg total) by mouth daily. 03/21/22  Yes Wendie Agreste, MD  albuterol (VENTOLIN HFA) 108 (90 Base) MCG/ACT inhaler Inhale 1-2 puffs into the lungs every 6 (six) hours as needed for wheezing. 03/21/22   Wendie Agreste, MD    Current Outpatient Medications  Medication Sig Dispense Refill   atorvastatin (LIPITOR) 10 MG tablet Take 1 tablet (10 mg total) by mouth daily. 90 tablet 3   lisinopril (ZESTRIL) 10 MG tablet Take 1 tablet (10 mg total) by mouth daily. 90 tablet 3   albuterol (VENTOLIN HFA) 108 (90 Base) MCG/ACT inhaler Inhale 1-2 puffs into the lungs every 6 (six) hours as needed for wheezing. 18 g 1   Current Facility-Administered Medications  Medication Dose Route Frequency Provider Last Rate Last Admin   0.9 %  sodium chloride infusion  500 mL Intravenous Once Daryel November, MD        Allergies as of 05/24/2022   (No Known Allergies)    Family History  Problem Relation Age of Onset   Cancer Father    Colon polyps Neg Hx    Colon cancer Neg Hx    Esophageal cancer Neg Hx     Rectal cancer Neg Hx    Stomach cancer Neg Hx     Social History   Socioeconomic History   Marital status: Single    Spouse name: Not on file   Number of children: Not on file   Years of education: Not on file   Highest education level: Not on file  Occupational History   Not on file  Tobacco Use   Smoking status: Never   Smokeless tobacco: Never  Vaping Use   Vaping Use: Never used  Substance and Sexual Activity   Alcohol use: Not Currently    Alcohol/week: 0.0 - 2.0 standard drinks   Drug use: No   Sexual activity: Yes  Other Topics Concern   Not on file  Social History Narrative   Not on file   Social Determinants of Health   Financial Resource Strain: Not on file  Food Insecurity: Not on file  Transportation Needs: Not on file  Physical Activity: Not on file  Stress: Not on file  Social Connections: Not on file  Intimate Partner Violence: Not on file    Review of Systems:  All other review of systems negative except as mentioned in the HPI.  Physical Exam: Vital signs BP 121/70  Pulse 62   Temp (!) 96.9 F (36.1 C)   Ht '5\' 10"'$  (1.778 m)   Wt 192 lb (87.1 kg)   SpO2 99%   BMI 27.55 kg/m   General:   Alert,  Well-developed, well-nourished, pleasant and cooperative in NAD Airway:  Mallampati 2 Lungs:  Clear throughout to auscultation.   Heart:  Regular rate and rhythm; no murmurs, clicks, rubs,  or gallops. Abdomen:  Soft, nontender and nondistended. Normal bowel sounds.   Neuro/Psych:  Normal mood and affect. A and O x 3   Eyoel Throgmorton E. Candis Schatz, MD [facility]

## 2022-05-24 NOTE — Op Note (Signed)
[facility] Patient Name: Curtis Berger Procedure Date: 05/24/2022 8:04 AM MRN: [id number] Endoscopist: Nicki Reaper E. Candis Schatz , MD Age: 56 Referring MD:  Date of Birth: [birth year] Gender: Male Account #: [id number] Procedure:                Colonoscopy Indications:              Screening for colorectal malignant neoplasm, This                            is the patient's first colonoscopy Medicines:                Monitored Anesthesia Care Procedure:                Pre-Anesthesia Assessment:                           - Prior to the procedure, a History and Physical                            was performed, and patient medications and                            allergies were reviewed. The patient's tolerance of                            previous anesthesia was also reviewed. The risks                            and benefits of the procedure and the sedation                            options and risks were discussed with the patient.                            All questions were answered, and informed consent                            was obtained. Prior Anticoagulants: The patient has                            taken no previous anticoagulant or antiplatelet                            agents. ASA Grade Assessment: II - A patient with                            mild systemic disease. After reviewing the risks                            and benefits, the patient was deemed in                            satisfactory condition to undergo the procedure.  After obtaining informed consent, the colonoscope                            was passed under direct vision. Throughout the                            procedure, the patient's blood pressure, pulse, and                            oxygen saturations were monitored continuously. The                            CF [id number] #[id number] was introduced through the anus                            and advanced to  the the terminal ileum, with                            identification of the appendiceal orifice and IC                            valve. The colonoscopy was performed without                            difficulty. The patient tolerated the procedure                            well. The quality of the bowel preparation was                            excellent. The terminal ileum, ileocecal valve,                            appendiceal orifice, and rectum were photographed.                            The bowel preparation used was SUPREP via split                            dose instruction. Scope In: 8:08:09 AM Scope Out: 8:23:50 AM Scope Withdrawal Time: 0 hours 13 minutes 1 second  Total Procedure Duration: 0 hours 15 minutes 41 seconds  Findings:                 The perianal and digital rectal examinations were                            normal. Pertinent negatives include normal                            sphincter tone and no palpable rectal lesions.                           A 4 mm polyp was found in the splenic flexure. The  polyp was sessile. The polyp was removed with a                            cold snare. Resection and retrieval were complete.                            Estimated blood loss was minimal.                           A 3 mm polyp was found in the descending colon. The                            polyp was sessile. The polyp was removed with a                            cold snare. Resection and retrieval were complete.                            Estimated blood loss was minimal.                           A 2 mm polyp was found in the rectum. The polyp was                            sessile. The polyp was removed with a cold snare.                            Resection and retrieval were complete. Estimated                            blood loss was minimal.                           A few small-mouthed diverticula were found in the                             sigmoid colon and descending colon.                           The exam was otherwise normal throughout the                            examined colon.                           The terminal ileum appeared normal.                           The retroflexed view of the distal rectum and anal                            verge was normal and showed no anal or rectal  abnormalities. Complications:            No immediate complications. Estimated Blood Loss:     Estimated blood loss was minimal. Impression:               - One 4 mm polyp at the splenic flexure, removed                            with a cold snare. Resected and retrieved.                           - One 3 mm polyp in the descending colon, removed                            with a cold snare. Resected and retrieved.                           - One 2 mm polyp in the rectum, removed with a cold                            snare. Resected and retrieved.                           - Diverticulosis in the sigmoid colon and in the                            descending colon.                           - The examined portion of the ileum was normal.                           - The distal rectum and anal verge are normal on                            retroflexion view. Recommendation:           - Patient has a contact number available for                            emergencies. The signs and symptoms of potential                            delayed complications were discussed with the                            patient. Return to normal activities tomorrow.                            Written discharge instructions were provided to the                            patient.                           - Resume previous  diet.                           - Continue present medications.                           - Await pathology results.                           - Repeat colonoscopy (date not yet determined)  for                            surveillance based on pathology results. Afnan Cadiente E. Candis Schatz, MD 05/24/2022 8:29:02 AM This report has been signed electronically.

## 2022-05-24 NOTE — Progress Notes (Signed)
Called to room to assist during endoscopic procedure.  Patient ID and intended procedure confirmed with present staff. Received instructions for my participation in the procedure from the performing physician.  

## 2022-05-28 ENCOUNTER — Telehealth: Payer: Self-pay | Admitting: *Deleted

## 2022-05-28 NOTE — Telephone Encounter (Signed)
  Follow up Call-     05/24/2022    7:16 AM  Call back number  Post procedure Call Back phone  # [phone]  Permission to leave phone message Yes     Patient questions:  Do you have a fever, pain , or abdominal swelling? No. Pain Score  0 *  Have you tolerated food without any problems? Yes.    Have you been able to return to your normal activities? Yes.    Do you have any questions about your discharge instructions: Diet   No. Medications  No. Follow up visit  No.  Do you have questions or concerns about your Care? No.  Actions: * If pain score is 4 or above: No action needed, pain <4.

## 2022-06-05 NOTE — Progress Notes (Signed)
Curtis Berger, Two polyps which I removed during your recent procedure were proven to be completely benign but are considered "pre-cancerous" polyps that MAY have grown into cancer if they had not been removed.  Studies shows that at least 20% of women over age 58 and 30% of men over age 31 have pre-cancerous polyps.  Based on current nationally recognized surveillance guidelines, I recommend that you have a repeat colonoscopy in 7 years.   If you develop any new rectal bleeding, abdominal pain or significant bowel habit changes, please contact me before then.

## 2023-03-24 ENCOUNTER — Encounter: Payer: 59 | Admitting: Family Medicine

## 2023-05-15 ENCOUNTER — Other Ambulatory Visit: Payer: Self-pay | Admitting: Family Medicine

## 2023-05-15 DIAGNOSIS — I10 Essential (primary) hypertension: Secondary | ICD-10-CM

## 2023-05-15 DIAGNOSIS — E785 Hyperlipidemia, unspecified: Secondary | ICD-10-CM

## 2023-08-16 ENCOUNTER — Other Ambulatory Visit: Payer: Self-pay | Admitting: Family Medicine

## 2023-08-16 DIAGNOSIS — E785 Hyperlipidemia, unspecified: Secondary | ICD-10-CM

## 2023-08-16 DIAGNOSIS — I10 Essential (primary) hypertension: Secondary | ICD-10-CM

## 2023-08-18 ENCOUNTER — Other Ambulatory Visit: Payer: Self-pay

## 2023-08-18 DIAGNOSIS — E785 Hyperlipidemia, unspecified: Secondary | ICD-10-CM

## 2023-08-18 DIAGNOSIS — I10 Essential (primary) hypertension: Secondary | ICD-10-CM

## 2023-08-18 MED ORDER — LISINOPRIL 10 MG PO TABS: 10.0000 mg | ORAL_TABLET | Freq: Every day | ORAL | 0 refills | Status: DC

## 2023-08-18 MED ORDER — ATORVASTATIN CALCIUM 10 MG PO TABS: 10.0000 mg | ORAL_TABLET | Freq: Every day | ORAL | 0 refills | Status: DC

## 2023-08-18 NOTE — Telephone Encounter (Signed)
Left pt a Vm stating we can only send In a 30 days supply with no refills . He needs an apt last OV was 03/21/22

## 2023-09-15 ENCOUNTER — Other Ambulatory Visit: Payer: Self-pay | Admitting: Family Medicine

## 2023-09-15 DIAGNOSIS — E785 Hyperlipidemia, unspecified: Secondary | ICD-10-CM

## 2023-09-15 DIAGNOSIS — I10 Essential (primary) hypertension: Secondary | ICD-10-CM

## 2023-09-22 ENCOUNTER — Encounter: Payer: Self-pay | Admitting: Family Medicine

## 2023-09-22 ENCOUNTER — Ambulatory Visit (INDEPENDENT_AMBULATORY_CARE_PROVIDER_SITE_OTHER): Payer: 59 | Admitting: Family Medicine

## 2023-09-22 VITALS — BP 140/80 | HR 84 | Temp 98.9°F | Ht 69.5 in | Wt 195.0 lb

## 2023-09-22 DIAGNOSIS — E785 Hyperlipidemia, unspecified: Secondary | ICD-10-CM | POA: Diagnosis not present

## 2023-09-22 DIAGNOSIS — Z131 Encounter for screening for diabetes mellitus: Secondary | ICD-10-CM

## 2023-09-22 DIAGNOSIS — Z23 Encounter for immunization: Secondary | ICD-10-CM | POA: Diagnosis not present

## 2023-09-22 DIAGNOSIS — J4520 Mild intermittent asthma, uncomplicated: Secondary | ICD-10-CM

## 2023-09-22 DIAGNOSIS — Z0000 Encounter for general adult medical examination without abnormal findings: Secondary | ICD-10-CM | POA: Diagnosis not present

## 2023-09-22 DIAGNOSIS — I10 Essential (primary) hypertension: Secondary | ICD-10-CM

## 2023-09-22 DIAGNOSIS — Z1329 Encounter for screening for other suspected endocrine disorder: Secondary | ICD-10-CM

## 2023-09-22 DIAGNOSIS — R972 Elevated prostate specific antigen [PSA]: Secondary | ICD-10-CM | POA: Diagnosis not present

## 2023-09-22 DIAGNOSIS — N529 Male erectile dysfunction, unspecified: Secondary | ICD-10-CM

## 2023-09-22 MED ORDER — ALBUTEROL SULFATE HFA 108 (90 BASE) MCG/ACT IN AERS: 1.0000 | INHALATION_SPRAY | Freq: Four times a day (QID) | RESPIRATORY_TRACT | 1 refills | Status: DC | PRN

## 2023-09-22 MED ORDER — LISINOPRIL 10 MG PO TABS
10.0000 mg | ORAL_TABLET | Freq: Every day | ORAL | 2 refills | Status: DC
Start: 2023-09-22 — End: 2024-06-02

## 2023-09-22 MED ORDER — SILDENAFIL CITRATE 50 MG PO TABS
25.0000 mg | ORAL_TABLET | Freq: Every day | ORAL | 0 refills | Status: DC | PRN
Start: 2023-09-22 — End: 2024-06-02

## 2023-09-22 MED ORDER — ATORVASTATIN CALCIUM 10 MG PO TABS
10.0000 mg | ORAL_TABLET | Freq: Every day | ORAL | 2 refills | Status: DC
Start: 2023-09-22 — End: 2024-06-02

## 2023-09-22 NOTE — Patient Instructions (Addendum)
Keep a record of your blood pressures outside of the office and if over 130/80 - let me know. No changes at this time.    Use lowest effective dose of Viagra.  However, let me know if that is ineffective or side effects with that medication.  Depending on PSA, he can decide if follow-up needed with urology.  I will let you know if other concerns on labs.  Take care!  Preventive Care 37-59 Years Old, Male Preventive care refers to lifestyle choices and visits with your health care provider that can promote health and wellness. Preventive care visits are also called wellness exams. What can I expect for my preventive care visit? Counseling During your preventive care visit, your health care provider may ask about your: Medical history, including: Past medical problems. Family medical history. Current health, including: Emotional well-being. Home life and relationship well-being. Sexual activity. Lifestyle, including: Alcohol, nicotine or tobacco, and drug use. Access to firearms. Diet, exercise, and sleep habits. Safety issues such as seatbelt and bike helmet use. Sunscreen use. Work and work Astronomer. Physical exam Your health care provider will check your: Height and weight. These may be used to calculate your BMI (body mass index). BMI is a measurement that tells if you are at a healthy weight. Waist circumference. This measures the distance around your waistline. This measurement also tells if you are at a healthy weight and may help predict your risk of certain diseases, such as type 2 diabetes and high blood pressure. Heart rate and blood pressure. Body temperature. Skin for abnormal spots. What immunizations do I need?  Vaccines are usually given at various ages, according to a schedule. Your health care provider will recommend vaccines for you based on your age, medical history, and lifestyle or other factors, such as travel or where you work. What tests do I  need? Screening Your health care provider may recommend screening tests for certain conditions. This may include: Lipid and cholesterol levels. Diabetes screening. This is done by checking your blood sugar (glucose) after you have not eaten for a while (fasting). Hepatitis B test. Hepatitis C test. HIV (human immunodeficiency virus) test. STI (sexually transmitted infection) testing, if you are at risk. Lung cancer screening. Prostate cancer screening. Colorectal cancer screening. Talk with your health care provider about your test results, treatment options, and if necessary, the need for more tests. Follow these instructions at home: Eating and drinking  Eat a diet that includes fresh fruits and vegetables, whole grains, lean protein, and low-fat dairy products. Take vitamin and mineral supplements as recommended by your health care provider. Do not drink alcohol if your health care provider tells you not to drink. If you drink alcohol: Limit how much you have to 0-2 drinks a day. Know how much alcohol is in your drink. In the [place] one drink equals one 12 oz bottle of beer (355 mL), one 5 oz glass of wine (148 mL), or one 1 oz glass of hard liquor (44 mL). Lifestyle Brush your teeth every morning and night with fluoride toothpaste. Floss one time each day. Exercise for at least 30 minutes 5 or more days each week. Do not use any products that contain nicotine or tobacco. These products include cigarettes, chewing tobacco, and vaping devices, such as e-cigarettes. If you need help quitting, ask your health care provider. Do not use drugs. If you are sexually active, practice safe sex. Use a condom or other form of protection to prevent STIs. Take aspirin only  as told by your health care provider. Make sure that you understand how much to take and what form to take. Work with your health care provider to find out whether it is safe and beneficial for you to take aspirin daily. Find  healthy ways to manage stress, such as: Meditation, yoga, or listening to music. Journaling. Talking to a trusted person. Spending time with friends and family. Minimize exposure to UV radiation to reduce your risk of skin cancer. Safety Always wear your seat belt while driving or riding in a vehicle. Do not drive: If you have been drinking alcohol. Do not ride with someone who has been drinking. When you are tired or distracted. While texting. If you have been using any mind-altering substances or drugs. Wear a helmet and other protective equipment during sports activities. If you have firearms in your house, make sure you follow all gun safety procedures. What's next? Go to your health care provider once a year for an annual wellness visit. Ask your health care provider how often you should have your eyes and teeth checked. Stay up to date on all vaccines. This information is not intended to replace advice given to you by your health care provider. Make sure you discuss any questions you have with your health care provider. Document Revised: 06/13/2021 Document Reviewed: 06/13/2021 Elsevier Patient Education  2024 [employer].

## 2023-09-22 NOTE — Progress Notes (Unsigned)
Subjective:  Patient ID: Curtis Berger, male    DOB: Oct 12, 1966  Age: 57 y.o. MRN: [id number]  CC:  Chief Complaint  Patient presents with   Annual Exam    HPI Curtis Berger presents for Annual Exam No health changes since last visit.   PCP, me ENT, Dr. Jenne Pane with prior acute suppurative otitis media, spontaneous rupture of eardrum in 2023. Gastroenterology, Dr. Tomasa Rand, colonoscopy 05/24/2022 - Multiple polyps removed, 2 adenomas.  Repeat colonoscopy in 7 years. Urology, Dr. Marlou Porch, eval for increasing PSA- see below.   Hypertension: Lisinopril 10 mg a day. Missed dose today - took late today.  Home readings: none.  BP Readings from Last 3 Encounters:  09/22/23 (!) 140/80  05/24/22 120/69  03/21/22 118/72   Lab Results  Component Value Date   CREATININE 0.95 03/21/2022   Wt Readings from Last 3 Encounters:  09/22/23 195 lb (88.5 kg)  05/24/22 192 lb (87.1 kg)  05/20/22 192 lb (87.1 kg)    Hyperlipidemia: Lipitor 10 mg daily - no myalgia/side effects No new side effects.  Recent trip to [place], some dietary indiscretion.  Lab Results  Component Value Date   CHOL 213 (H) 03/21/2022   HDL 63.10 03/21/2022   LDLCALC 116 (H) 03/21/2022   TRIG 169.0 (H) 03/21/2022   CHOLHDL 3 03/21/2022   Lab Results  Component Value Date   ALT 34 03/21/2022   AST 29 03/21/2022   ALKPHOS 62 03/21/2022   BILITOT 0.7 03/21/2022    Mild intermittent asthma Well controlled - rare need for albuterol - usually spring. Needs RF.       09/22/2023    3:10 PM 03/21/2022    8:10 AM 02/18/2022    3:43 PM 09/17/2021    2:52 PM 02/16/2021    8:26 AM  Depression screen PHQ 2/9  Decreased Interest 0 0 0 0 0  Down, Depressed, Hopeless 0 0 0 0 0  PHQ - 2 Score 0 0 0 0 0  Altered sleeping 0 0     Tired, decreased energy 0 0     Change in appetite 0 0     Feeling bad or failure about yourself  0 0     Trouble concentrating 0 0     Moving slowly or fidgety/restless 0  0     Suicidal thoughts 0 0     PHQ-9 Score 0 0     Difficult doing work/chores Not difficult at all        Health Maintenance  Topic Date Due   INFLUENZA VACCINE  07/31/2023   COVID-19 Vaccine (3 - 2023-24 season) 08/31/2023   Zoster Vaccines- Shingrix (1 of 2) 12/22/2023 (Originally 09/12/2014)   DTaP/Tdap/Td (3 - Td or Tdap) 10/23/2028   Colonoscopy  05/24/2029   Hepatitis C Screening  Completed   HIV Screening  Completed   HPV VACCINES  Aged Out  Colonoscopy as above in 2023 - repeat 79yrs.  Prostate: does not have family history of prostate cancer The natural history of prostate cancer and ongoing controversy regarding screening and potential treatment outcomes of prostate cancer has been discussed with the patient. The meaning of a false positive PSA and a false negative PSA has been discussed. He indicates understanding of the limitations of this screening test and wishes  to proceed with screening PSA testing. PSA had increased from 2.5 in 2022, up to 3.3 in March of last year.  Urology follow-up recommended.  Appointment noted from August 2023,  Dr. Marlou Porch plan for repeat 6 months PSA, then decide on further evaluation depending on trend.  Unknown PSA at urology initial test.only.    Some difficulty with maintenance of erection at times.  Same partner for 20 yrs.  Would like to try. Viagra. No CP/DOE.  Side effects discussed (including but not limited to headache/flushing, blue discoloration of vision, possible vascular steal and risk of cardiac effects if underlying unknown coronary artery disease, and permanent sensorineural hearing loss). Understanding expressed.  Lab Results  Component Value Date   PSA1 2.5 02/12/2021   PSA1 1.2 10/23/2018   PSA1 1.3 10/07/2017   PSA 3.30 03/21/2022   PSA 1.00 03/16/2016   PSA 0.98 11/08/2013    Immunization History  Administered Date(s) Administered   Influenza Split 10/29/2012, 10/17/2014   Influenza,inj,Quad PF,6+ Mos 11/08/2013,  10/07/2017, 10/23/2018, 11/17/2019, 11/01/2020, 09/17/2021   Influenza-Unspecified 10/30/2014   PFIZER(Purple Top)SARS-COV-2 Vaccination 04/07/2020, 05/01/2020   Tdap 12/31/2007, 10/23/2018  Flu vaccine today. Shingrix- waiting until 60yo.  Covid booster - declines.   No results found. Optho - yearly.   Dental - every 6 months.   Alcohol: 1 per week.   Tobacco: none.   Exercise: cardio 4 days per week, walk, tennis. Weights/machines 4 days per week.    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 Authorizing Provider  albuterol (VENTOLIN HFA) 108 (90 Base) MCG/ACT inhaler Inhale 1-2 puffs into the lungs every 6 (six) hours as needed for wheezing. 03/21/22  Yes Shade Flood, MD  atorvastatin (LIPITOR) 10 MG tablet Take 1 tablet (10 mg total) by mouth daily. 08/18/23  Yes Shade Flood, MD  lisinopril (ZESTRIL) 10 MG tablet Take 1 tablet (10 mg total) by mouth daily. 08/18/23  Yes Shade Flood, MD   Social History   Socioeconomic History   Marital status: Single    Spouse name: Not on file   Number of children: Not on file   Years of education: Not on file   Highest education level: Not on file  Occupational History   Not on file  Tobacco Use   Smoking status: Never   Smokeless tobacco: Never  Vaping Use   Vaping status: Never Used  Substance and Sexual Activity   Alcohol use: Not Currently    Alcohol/week: 0.0 - 2.0  standard drinks of alcohol   Drug use: No   Sexual activity: Yes  Other Topics Concern   Not on file  Social History Narrative   Not on file   Social Determinants of Health   Financial Resource Strain: Not on file  Food Insecurity: Not on file  Transportation Needs: Not on file  Physical Activity: Not on file  Stress: Not on file  Social Connections: Not on file  Intimate Partner Violence: Not on file    Review of Systems 13 point review of systems  per patient health survey noted.  Negative other than as indicated above or in HPI.    Objective:   Vitals:   09/22/23 1457 09/22/23 1508  BP: (!) 150/80 (!) 140/80  Pulse: 84   Temp: 98.9 F (37.2 C)   TempSrc: Oral   SpO2: 99%   Weight: 195 lb (88.5 kg)   Height: 5' 9.5" (1.765 m)    {Vitals History (Optional):23777}  Physical Exam Vitals reviewed.  Constitutional:      Appearance: He is well-developed.  HENT:     Head: Normocephalic and atraumatic.     Right Ear: External ear normal.     Left Ear: External ear normal.  Eyes:     Conjunctiva/sclera: Conjunctivae normal.     Pupils: Pupils are equal, round, and reactive to light.  Neck:     Thyroid: No thyromegaly.  Cardiovascular:     Rate and Rhythm: Normal rate and regular rhythm.     Heart sounds: Normal heart sounds.  Pulmonary:     Effort: Pulmonary effort is normal. No respiratory distress.     Breath sounds: Normal breath sounds. No wheezing.  Abdominal:     General: There is no distension.     Palpations: Abdomen is soft.     Tenderness: There is no abdominal tenderness.  Musculoskeletal:        General: No tenderness. Normal range of motion.     Cervical back: Normal range of motion and neck supple.  Lymphadenopathy:     Cervical: No cervical adenopathy.  Skin:    General: Skin is warm and dry.  Neurological:     Mental Status: He is alert and oriented to person, place, and time.     Deep Tendon Reflexes: Reflexes are normal and symmetric.   Psychiatric:        Behavior: Behavior normal.        Assessment & Plan:  Curtis Berger is a 57 y.o. male . Essential hypertension  Hyperlipidemia, unspecified hyperlipidemia type  Increased prostate specific antigen (PSA) velocity  Screening for diabetes mellitus   No orders of the defined types were placed in this encounter.  There are no Patient Instructions on file for this visit.    Signed,   Meredith Staggers, MD [facility], [facility] 09/22/23 3:14 PM

## 2023-09-23 ENCOUNTER — Telehealth: Payer: Self-pay

## 2023-09-23 LAB — LIPID PANEL
Cholesterol: 197 mg/dL (ref 0–200)
HDL: 59.8 mg/dL (ref >39.00)
LDL Cholesterol: 115 mg/dL — ABNORMAL HIGH (ref 0–99)
NonHDL: 137.48
Total CHOL/HDL Ratio: 3
Triglycerides: 114 mg/dL (ref 0.0–149.0)
VLDL: 22.8 mg/dL (ref 0.0–40.0)

## 2023-09-23 LAB — HEMOGLOBIN A1C: Hgb A1c MFr Bld: 5.5 % (ref 4.6–6.5)

## 2023-09-23 LAB — CBC WITH DIFFERENTIAL/PLATELET
Basophils Absolute: 0 10*3/uL (ref 0.0–0.1)
Basophils Relative: 0.8 % (ref 0.0–3.0)
Eosinophils Absolute: 0 10*3/uL (ref 0.0–0.7)
Eosinophils Relative: 0.1 % (ref 0.0–5.0)
HCT: 42.4 % (ref 39.0–52.0)
Hemoglobin: 14.5 g/dL (ref 13.0–17.0)
Lymphocytes Relative: 14.2 % (ref 12.0–46.0)
Lymphs Abs: 0.8 10*3/uL (ref 0.7–4.0)
MCHC: 34.1 g/dL (ref 30.0–36.0)
MCV: 86.4 fl (ref 78.0–100.0)
Monocytes Absolute: 0.4 10*3/uL (ref 0.1–1.0)
Monocytes Relative: 7.9 % (ref 3.0–12.0)
Neutro Abs: 4.2 10*3/uL (ref 1.4–7.7)
Neutrophils Relative %: 77 % (ref 43.0–77.0)
Platelets: 221 10*3/uL (ref 150.0–400.0)
RBC: 4.91 Mil/uL (ref 4.22–5.81)
RDW: 13 % (ref 11.5–15.5)
WBC: 5.4 10*3/uL (ref 4.0–10.5)

## 2023-09-23 LAB — COMPREHENSIVE METABOLIC PANEL
ALT: 26 U/L (ref 0–53)
AST: 25 U/L (ref 0–37)
Albumin: 4.6 g/dL (ref 3.5–5.2)
Alkaline Phosphatase: 58 U/L (ref 39–117)
BUN: 15 mg/dL (ref 6–23)
CO2: 29 mEq/L (ref 19–32)
Calcium: 9.7 mg/dL (ref 8.4–10.5)
Chloride: 103 mEq/L (ref 96–112)
Creatinine, Ser: 1 mg/dL (ref 0.40–1.50)
GFR: 82.66 mL/min (ref >60.00)
Glucose, Bld: 101 mg/dL — ABNORMAL HIGH (ref 70–99)
Potassium: 4.4 mEq/L (ref 3.5–5.1)
Sodium: 140 mEq/L (ref 135–145)
Total Bilirubin: 0.6 mg/dL (ref 0.2–1.2)
Total Protein: 6.8 g/dL (ref 6.0–8.3)

## 2023-09-23 LAB — PSA: PSA: 2.8 ng/mL (ref 0.10–4.00)

## 2023-09-23 LAB — TSH: TSH: 2 u[IU]/mL (ref 0.35–5.50)

## 2023-09-23 NOTE — Telephone Encounter (Signed)
-----   Message from Shade Flood sent at 09/23/2023  3:42 PM EDT ----- Call patient.  Thyroid test, prostate test were normal.  Prostate test is lower than last year.  Cholesterol was slightly elevated but overall improved total cholesterol from last year.  Blood sugar was a few points elevated but not concerning and stable from previous readings, 58-month blood sugar test was stable and in the normal range.  Blood counts were normal.  Let me know if there are questions.  Dr. Neva Seat

## 2023-09-24 NOTE — Telephone Encounter (Signed)
Pt has been notified.

## 2023-12-18 ENCOUNTER — Telehealth: Payer: Self-pay

## 2023-12-18 NOTE — Telephone Encounter (Signed)
Copied from CRM [id number]. Topic: Clinical - Medication Refill >> Dec 18, 2023  1:30 PM Theodis Sato wrote: Most Recent Primary Care Visit:  Provider: Meredith Staggers R  Department: [facility]  Visit Type: PHYSICAL  Date: 09/22/2023  Medication: sildenafil (VIAGRA) 50 MG tablet  Has the patient contacted their pharmacy? PT is out of re-fills (Agent: If no, request that the patient contact the pharmacy for the refill. If patient does not wish to contact the pharmacy document the reason why and proceed with request.) (Agent: If yes, when and what did the pharmacy advise?)  Is this the correct pharmacy for this prescription? Yes If no, delete pharmacy and type the correct one.  This is the patient's preferred pharmacy:  [facility] [address], [place] - [address] [place] [address] Phone: [phone] Fax: [phone]   Has the prescription been filled recently? Yes  Is the patient out of the medication? Yes  Has the patient been seen for an appointment in the last year OR does the patient have an upcoming appointment? Yes  Can we respond through MyChart? No  Agent: Please be advised that Rx refills may take up to 3 business days. We ask that you follow-up with your pharmacy.

## 2024-06-02 ENCOUNTER — Encounter: Payer: Self-pay | Admitting: Family Medicine

## 2024-06-02 ENCOUNTER — Ambulatory Visit (INDEPENDENT_AMBULATORY_CARE_PROVIDER_SITE_OTHER): Admitting: Family Medicine

## 2024-06-02 VITALS — BP 124/70 | HR 60 | Temp 97.9°F | Ht 69.5 in | Wt 194.0 lb

## 2024-06-02 DIAGNOSIS — E785 Hyperlipidemia, unspecified: Secondary | ICD-10-CM | POA: Diagnosis not present

## 2024-06-02 DIAGNOSIS — F439 Reaction to severe stress, unspecified: Secondary | ICD-10-CM

## 2024-06-02 DIAGNOSIS — J4520 Mild intermittent asthma, uncomplicated: Secondary | ICD-10-CM | POA: Diagnosis not present

## 2024-06-02 DIAGNOSIS — I10 Essential (primary) hypertension: Secondary | ICD-10-CM | POA: Diagnosis not present

## 2024-06-02 DIAGNOSIS — N529 Male erectile dysfunction, unspecified: Secondary | ICD-10-CM

## 2024-06-02 LAB — COMPREHENSIVE METABOLIC PANEL WITH GFR
ALT: 26 U/L (ref 0–53)
AST: 24 U/L (ref 0–37)
Albumin: 4.6 g/dL (ref 3.5–5.2)
Alkaline Phosphatase: 62 U/L (ref 39–117)
BUN: 17 mg/dL (ref 6–23)
CO2: 28 meq/L (ref 19–32)
Calcium: 9.4 mg/dL (ref 8.4–10.5)
Chloride: 102 meq/L (ref 96–112)
Creatinine, Ser: 1.09 mg/dL (ref 0.40–1.50)
GFR: 74.18 mL/min (ref >60.00)
Glucose, Bld: 105 mg/dL — ABNORMAL HIGH (ref 70–99)
Potassium: 4.8 meq/L (ref 3.5–5.1)
Sodium: 137 meq/L (ref 135–145)
Total Bilirubin: 0.7 mg/dL (ref 0.2–1.2)
Total Protein: 7.2 g/dL (ref 6.0–8.3)

## 2024-06-02 LAB — LIPID PANEL
Cholesterol: 167 mg/dL (ref 0–200)
HDL: 43.6 mg/dL (ref >39.00)
LDL Cholesterol: 98 mg/dL (ref 0–99)
NonHDL: 123.17
Total CHOL/HDL Ratio: 4
Triglycerides: 124 mg/dL (ref 0.0–149.0)
VLDL: 24.8 mg/dL (ref 0.0–40.0)

## 2024-06-02 MED ORDER — LISINOPRIL 10 MG PO TABS: 10.0000 mg | ORAL_TABLET | Freq: Every day | ORAL | 2 refills | Status: AC

## 2024-06-02 MED ORDER — ATORVASTATIN CALCIUM 10 MG PO TABS
10.0000 mg | ORAL_TABLET | Freq: Every day | ORAL | 2 refills | Status: AC
Start: 2024-06-02 — End: ?

## 2024-06-02 MED ORDER — ALBUTEROL SULFATE HFA 108 (90 BASE) MCG/ACT IN AERS: 1.0000 | INHALATION_SPRAY | Freq: Four times a day (QID) | RESPIRATORY_TRACT | 1 refills | Status: AC | PRN

## 2024-06-02 MED ORDER — SILDENAFIL CITRATE 50 MG PO TABS
25.0000 mg | ORAL_TABLET | Freq: Every day | ORAL | 5 refills | Status: AC | PRN
Start: 2024-06-02 — End: ?

## 2024-06-02 NOTE — Progress Notes (Signed)
 Subjective:  Patient ID: Curtis Berger, male    DOB: 04/03/1966  Age: 58 y.o. MRN: [id number]  CC:  Chief Complaint  Patient presents with   Follow-up    Med check and labs; doing well with no concerns    HPI Curtis Berger presents for   Follow up.  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 Some stress with his older sister's health challenges. Metastatic breast CA. Palliative care planned - given 6 months.  Some other life stressors. Exercise has bene helping to manage stress some, that is his usual coping mechanism. Some waking up in middle of night at times thinking about things to do. Getting to sleep ok.  Denies depression, or need for therapist.   Hypertension: Lisinopril  10 mg daily.  Elevated last visit but he had taken his dose late.  No new side effects with meds. Home readings: none BP Readings from Last 3 Encounters:  06/02/24 124/70  09/22/23 (!) 140/80  05/24/22 120/69   Lab Results  Component Value Date   CREATININE 1.00 09/22/2023   Hyperlipidemia: Treated Lipitor 10 mg daily without new myalgias or side effects.  FH of heart disease - none. Option of CCS  to decide if continued statin is needed or adjusted. - will check labs first.   Lab Results  Component Value Date   CHOL 197 09/22/2023   HDL 59.80 09/22/2023   LDLCALC 115 (H) 09/22/2023   TRIG 114.0 09/22/2023   CHOLHDL 3 09/22/2023   Lab Results  Component Value Date   ALT 26 09/22/2023   AST 25 09/22/2023   ALKPHOS 58 09/22/2023   BILITOT 0.6 09/22/2023   Mild intermittent asthma Rare need for albuterol  previously, typically springtime.  Recently has not needed - still seasonal. Used few times with spring allergies.   Erectile dysfunction Discussed at his physical in September.  Decided to try Viagra .  Denies headache, slight flushing, no chest pain or dyspnea with exertion, or hearing/vision changes.  Current dosing 50mg  - working good.    HM: Prevnar - deferred.   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 Authorizing Provider  albuterol  (VENTOLIN  HFA) 108 (90 Base) MCG/ACT inhaler Inhale 1-2 puffs into the lungs every 6 (six) hours as needed for wheezing. 09/22/23  Yes Benjiman Bras, MD  atorvastatin  (LIPITOR) 10 MG tablet Take 1 tablet (10 mg total) by mouth daily. 09/22/23  Yes Benjiman Bras, MD  lisinopril  (ZESTRIL ) 10 MG tablet Take 1 tablet (10 mg total) by mouth daily. 09/22/23  Yes Benjiman Bras, MD  sildenafil  (VIAGRA ) 50 MG tablet Take 0.5-1 tablets (25-50 mg total) by mouth daily as needed for erectile dysfunction. 09/22/23  Yes Benjiman Bras, MD   Social History   Socioeconomic History   Marital status: Single  Spouse name: Not on file   Number of children: Not on file   Years of education: Not on file   Highest education level: Not on file  Occupational History   Not on file  Tobacco Use   Smoking status: Never   Smokeless tobacco: Never  Vaping Use   Vaping status: Never Used  Substance and Sexual Activity   Alcohol use: Not Currently    Alcohol/week: 0.0 - 2.0 standard drinks of alcohol   Drug use: No   Sexual activity: Yes  Other Topics Concern   Not on file  Social History Narrative   Not on file   Social Drivers of Health   Financial Resource Strain: Not on file  Food Insecurity: Not on file  Transportation Needs: Not on file   Physical Activity: Not on file  Stress: Not on file  Social Connections: Not on file  Intimate Partner Violence: Not on file    Review of Systems  Constitutional:  Negative for fatigue and unexpected weight change.  Eyes:  Negative for visual disturbance.  Respiratory:  Negative for cough, chest tightness and shortness of breath.   Cardiovascular:  Negative for chest pain, palpitations and leg swelling.  Gastrointestinal:  Negative for abdominal pain and blood in stool.  Neurological:  Negative for dizziness, light-headedness and headaches.     Objective:   Vitals:   06/02/24 1007  BP: 124/70  Pulse: 60  Temp: 97.9 F (36.6 C)  TempSrc: Temporal  SpO2: 99%  Weight: 194 lb (88 kg)  Height: 5' 9.5" (1.765 m)     Physical Exam Vitals reviewed.  Constitutional:      Appearance: He is well-developed.  HENT:     Head: Normocephalic and atraumatic.  Neck:     Vascular: No carotid bruit or JVD.  Cardiovascular:     Rate and Rhythm: Normal rate and regular rhythm.     Heart sounds: Normal heart sounds. No murmur heard. Pulmonary:     Effort: Pulmonary effort is normal.     Breath sounds: Normal breath sounds. No rales.  Musculoskeletal:     Right lower leg: No edema.     Left lower leg: No edema.  Skin:    General: Skin is warm and dry.  Neurological:     Mental Status: He is alert and oriented to person, place, and time.  Psychiatric:        Mood and Affect: Mood normal.        Assessment & Plan:  CAILEN MIHALIK is a 58 y.o. male . Essential hypertension - Plan: lisinopril  (ZESTRIL ) 10 MG tablet  -  Stable, tolerating current regimen. Medications refilled. Labs pending as above.   Mild intermittent asthma without complication - Plan: albuterol  (VENTOLIN  HFA) 108 (90 Base) MCG/ACT inhaler  - Stable with rare need of albuterol , refilled, RTC precautions.  Did report cold symptoms approximately 2 weeks ago, improved, no fevers.  Lungs clear on  exam.  Hyperlipidemia, unspecified hyperlipidemia type - Plan: atorvastatin  (LIPITOR) 10 MG tablet, Comprehensive metabolic panel with GFR, Lipid panel  - Tolerating current dose Lipitor.  Check labs, consider coronary calcium  scoring to decide if adjustment in dose or continued need for statin.  Labs first.  Erectile dysfunction, unspecified erectile dysfunction type - Plan: sildenafil  (VIAGRA ) 50 MG tablet  - Other than flushing any side effects.  Potential risks and side effects were discussed.  Continue lowest effective dose.  Refill Viagra .  Situational stress  - Unfortunate stressors as  above including his sisters worsening health and plan for palliative care.  He does have good coping techniques at this time, additional information provided for stress management and discussed making a list in the middle of night if waking up with stressors.  Advised to let me know if other assistance needed but appears to be handling stressors sufficiently at this time.    RTC precautions given, otherwise 6 months follow-up for physical.  Meds ordered this encounter  Medications   albuterol  (VENTOLIN  HFA) 108 (90 Base) MCG/ACT inhaler    Sig: Inhale 1-2 puffs into the lungs every 6 (six) hours as needed for wheezing.    Dispense:  18 g    Refill:  1   atorvastatin  (LIPITOR) 10 MG tablet    Sig: Take 1 tablet (10 mg total) by mouth daily.    Dispense:  90 tablet    Refill:  2   lisinopril  (ZESTRIL ) 10 MG tablet    Sig: Take 1 tablet (10 mg total) by mouth daily.    Dispense:  90 tablet    Refill:  2   sildenafil  (VIAGRA ) 50 MG tablet    Sig: Take 0.5-1 tablets (25-50 mg total) by mouth daily as needed for erectile dysfunction.    Dispense:  10 tablet    Refill:  5   Patient Instructions  Sorry to hear about the increased stressors in life right now and sorry to hear about your sister's health decline. Please let me know if I can help. See info on stress below, try list in middle of night if  needed to help with tasks and late night thoughts.   No med changes today. Depending on cholesterol levels, can discuss coronary calcium  scoring.   Hang in there.    Managing Stress, Adult Feeling a certain amount of stress is normal. Stress helps our body and mind get ready to deal with the demands of life. Stress hormones can motivate you to do well at work and meet your responsibilities. But severe or long-term (chronic) stress can affect your mental and physical health. Chronic stress puts you at higher risk for: Anxiety and depression. Other health problems such as digestive problems, muscle aches, heart disease, high blood pressure, and stroke. What are the causes? Common causes of stress include: Demands from work, such as deadlines, feeling overworked, or having long hours. Pressures at home, such as money issues, disagreements with a spouse, or parenting issues. Pressures from major life changes, such as divorce, moving, loss of a loved one, or chronic illness. You may be at higher risk for stress-related problems if you: Do not get enough sleep. Are in poor health. Do not have emotional support. Have a mental health disorder such as anxiety or depression. How to recognize stress Stress can make you: Have trouble sleeping. Feel sad, anxious, irritable, or overwhelmed. Lose your appetite. Overeat or want to eat unhealthy foods. Want to use drugs or alcohol. Stress can also cause physical symptoms, such as: Sore, tense muscles, especially in the shoulders and neck. Headaches. Trouble breathing. A faster heart rate. Stomach pain, nausea, or vomiting. Diarrhea or constipation. Trouble concentrating. Follow these instructions at home: Eating and drinking Eat a healthy diet. This includes: Eating foods that are high in fiber, such as beans, whole grains, and fresh fruits and vegetables. Limiting foods that are high in fat and processed sugars, such as fried or sweet  foods. Do not skip meals or overeat. Drink enough fluid to keep your urine pale  yellow. Alcohol use Do not drink alcohol if: Your health care provider tells you not to drink. You are pregnant, may be pregnant, or are planning to become pregnant. Drinking alcohol is a way some people try to ease their stress. This can be dangerous, so if you drink alcohol: Limit how much you have to: 0-1 drink a day for women. 0-2 drinks a day for men. Know how much alcohol is in your drink. In the [place] one drink equals one 12 oz bottle of beer (355 mL), one 5 oz glass of wine (148 mL), or one 1 oz glass of hard liquor (44 mL). Activity  Include 30 minutes of exercise in your daily schedule. Exercise is a good stress reducer. Include time in your day for an activity that you find relaxing. Try taking a walk, going on a bike ride, reading a book, or listening to music. Schedule your time in a way that lowers stress, and keep a regular schedule. Focus on doing what is most important to get done. Lifestyle Identify the source of your stress and your reaction to it. See a therapist who can help you change unhelpful reactions. When there are stressful events: Talk about them with family, friends, or coworkers. Try to think realistically about stressful events and not ignore them or overreact. Try to find the positives in a stressful situation and not focus on the negatives. Cut back on responsibilities at work and home, if possible. Ask for help from friends or family members if you need it. Find ways to manage stress, such as: Mindfulness, meditation, or deep breathing. Yoga or tai chi. Progressive muscle relaxation. Spending time in nature. Doing art, playing music, or reading. Making time for fun activities. Spending time with family and friends. Get support from family, friends, or spiritual resources. General instructions Get enough sleep. Try to go to sleep and get up at about the same time every  day. Take over-the-counter and prescription medicines only as told by your health care provider. Do not use any products that contain nicotine or tobacco. These products include cigarettes, chewing tobacco, and vaping devices, such as e-cigarettes. If you need help quitting, ask your health care provider. Do not use drugs or smoke to deal with stress. Keep all follow-up visits. This is important. Where to find support Talk with your health care provider about stress management or finding a support group. Find a therapist to work with you on your stress management techniques. Where to find more information [employer] on Mental Illness: www.nami.org American Psychological Association: Dice[employer].ca Contact a health care provider if: Your stress symptoms get worse. You are unable to manage your stress at home. You are struggling to stop using drugs or alcohol. Get help right away if: You may be a danger to yourself or others. You have any thoughts of death or suicide. Get help right awayif you feel like you may hurt yourself or others, or have thoughts about taking your own life. Go to your nearest emergency room or: Call 911. Call the National Suicide Prevention Lifeline at 787-851-4782 or 988 in the [place] This is open 24 hours a day. If you're a Veteran: Call 988 and press 1. This is open 24 hours a day. Text the [employer] at 408-624-4582. Summary Feeling a certain amount of stress is normal, but severe or long-term (chronic) stress can affect your mental and physical health. Chronic stress can put you at higher risk for anxiety, depression, and other health problems such  as digestive problems, muscle aches, heart disease, high blood pressure, and stroke. You may be at higher risk for stress-related problems if you do not get enough sleep, are in poor health, lack emotional support, or have a mental health disorder such as anxiety or depression. Identify the source of your  stress and your reaction to it. Try talking about stressful events with family, friends, or coworkers, finding a coping method, or getting support from spiritual resources. If you need more help, talk with your health care provider about finding a support group or a mental health therapist. This information is not intended to replace advice given to you by your health care provider. Make sure you discuss any questions you have with your health care provider. Document Revised: 07/31/2023 Document Reviewed: 07/10/2021 Elsevier Patient Education  2024 Elsevier Inc.    Signed,   Caro Christmas, MD [facility], [facility] 06/02/24 10:59 AM

## 2024-06-02 NOTE — Patient Instructions (Addendum)
 Sorry to hear about the increased stressors in life right now and sorry to hear about your sister's health decline. Please let me know if I can help. See info on stress below, try list in middle of night if needed to help with tasks and late night thoughts.   No med changes today. Depending on cholesterol levels, can discuss coronary calcium  scoring.   Hang in there.    Managing Stress, Adult Feeling a certain amount of stress is normal. Stress helps our body and mind get ready to deal with the demands of life. Stress hormones can motivate you to do well at work and meet your responsibilities. But severe or long-term (chronic) stress can affect your mental and physical health. Chronic stress puts you at higher risk for: Anxiety and depression. Other health problems such as digestive problems, muscle aches, heart disease, high blood pressure, and stroke. What are the causes? Common causes of stress include: Demands from work, such as deadlines, feeling overworked, or having long hours. Pressures at home, such as money issues, disagreements with a spouse, or parenting issues. Pressures from major life changes, such as divorce, moving, loss of a loved one, or chronic illness. You may be at higher risk for stress-related problems if you: Do not get enough sleep. Are in poor health. Do not have emotional support. Have a mental health disorder such as anxiety or depression. How to recognize stress Stress can make you: Have trouble sleeping. Feel sad, anxious, irritable, or overwhelmed. Lose your appetite. Overeat or want to eat unhealthy foods. Want to use drugs or alcohol. Stress can also cause physical symptoms, such as: Sore, tense muscles, especially in the shoulders and neck. Headaches. Trouble breathing. A faster heart rate. Stomach pain, nausea, or vomiting. Diarrhea or constipation. Trouble concentrating. Follow these instructions at home: Eating and drinking Eat a healthy  diet. This includes: Eating foods that are high in fiber, such as beans, whole grains, and fresh fruits and vegetables. Limiting foods that are high in fat and processed sugars, such as fried or sweet foods. Do not skip meals or overeat. Drink enough fluid to keep your urine pale yellow. Alcohol use Do not drink alcohol if: Your health care provider tells you not to drink. You are pregnant, may be pregnant, or are planning to become pregnant. Drinking alcohol is a way some people try to ease their stress. This can be dangerous, so if you drink alcohol: Limit how much you have to: 0-1 drink a day for women. 0-2 drinks a day for men. Know how much alcohol is in your drink. In the [place] one drink equals one 12 oz bottle of beer (355 mL), one 5 oz glass of wine (148 mL), or one 1 oz glass of hard liquor (44 mL). Activity  Include 30 minutes of exercise in your daily schedule. Exercise is a good stress reducer. Include time in your day for an activity that you find relaxing. Try taking a walk, going on a bike ride, reading a book, or listening to music. Schedule your time in a way that lowers stress, and keep a regular schedule. Focus on doing what is most important to get done. Lifestyle Identify the source of your stress and your reaction to it. See a therapist who can help you change unhelpful reactions. When there are stressful events: Talk about them with family, friends, or coworkers. Try to think realistically about stressful events and not ignore them or overreact. Try to find the positives in a  stressful situation and not focus on the negatives. Cut back on responsibilities at work and home, if possible. Ask for help from friends or family members if you need it. Find ways to manage stress, such as: Mindfulness, meditation, or deep breathing. Yoga or tai chi. Progressive muscle relaxation. Spending time in nature. Doing art, playing music, or reading. Making time for fun  activities. Spending time with family and friends. Get support from family, friends, or spiritual resources. General instructions Get enough sleep. Try to go to sleep and get up at about the same time every day. Take over-the-counter and prescription medicines only as told by your health care provider. Do not use any products that contain nicotine or tobacco. These products include cigarettes, chewing tobacco, and vaping devices, such as e-cigarettes. If you need help quitting, ask your health care provider. Do not use drugs or smoke to deal with stress. Keep all follow-up visits. This is important. Where to find support Talk with your health care provider about stress management or finding a support group. Find a therapist to work with you on your stress management techniques. Where to find more information [employer] on Mental Illness: www.nami.org American Psychological Association: [employer]ca Contact a health care provider if: Your stress symptoms get worse. You are unable to manage your stress at home. You are struggling to stop using drugs or alcohol. Get help right away if: You may be a danger to yourself or others. You have any thoughts of death or suicide. Get help right awayif you feel like you may hurt yourself or others, or have thoughts about taking your own life. Go to your nearest emergency room or: Call 911. Call the National Suicide Prevention Lifeline at (715) 536-6171 or 988 in the [place] This is open 24 hours a day. If you're a Veteran: Call 988 and press 1. This is open 24 hours a day. Text the [employer] at 438-666-3130. Summary Feeling a certain amount of stress is normal, but severe or long-term (chronic) stress can affect your mental and physical health. Chronic stress can put you at higher risk for anxiety, depression, and other health problems such as digestive problems, muscle aches, heart disease, high blood pressure, and stroke. You may be at  higher risk for stress-related problems if you do not get enough sleep, are in poor health, lack emotional support, or have a mental health disorder such as anxiety or depression. Identify the source of your stress and your reaction to it. Try talking about stressful events with family, friends, or coworkers, finding a coping method, or getting support from spiritual resources. If you need more help, talk with your health care provider about finding a support group or a mental health therapist. This information is not intended to replace advice given to you by your health care provider. Make sure you discuss any questions you have with your health care provider. Document Revised: 07/31/2023 Document Reviewed: 07/10/2021 Elsevier Patient Education  2024 [employer].

## 2024-06-08 ENCOUNTER — Ambulatory Visit: Payer: Self-pay | Admitting: Family Medicine

## 2024-06-09 NOTE — Telephone Encounter (Signed)
 Copied from CRM [id number]. Topic: Clinical - Lab/Test Results >> Jun 09, 2024  1:05 PM Jenice Mitts wrote: Reason for CRM: Patient calling in to give cma Tauri Ethington a call back about his lab results  He can be reached at his mobile number

## 2024-06-09 NOTE — Telephone Encounter (Signed)
 Returning patients call to go over lab results. Left VM to return call.
